# Patient Record
Sex: Male | Born: 1960
Health system: Southern US, Community
[De-identification: ages and names within clinical notes are randomized; demographics above are authoritative.]

## PROBLEM LIST (undated history)

## (undated) DIAGNOSIS — K222 Esophageal obstruction: Secondary | ICD-10-CM

## (undated) DIAGNOSIS — I4891 Unspecified atrial fibrillation: Secondary | ICD-10-CM

## (undated) DIAGNOSIS — M1990 Unspecified osteoarthritis, unspecified site: Secondary | ICD-10-CM

## (undated) DIAGNOSIS — I10 Essential (primary) hypertension: Secondary | ICD-10-CM

## (undated) DIAGNOSIS — G4730 Sleep apnea, unspecified: Secondary | ICD-10-CM

## (undated) DIAGNOSIS — E785 Hyperlipidemia, unspecified: Secondary | ICD-10-CM

## (undated) DIAGNOSIS — K219 Gastro-esophageal reflux disease without esophagitis: Secondary | ICD-10-CM

## (undated) DIAGNOSIS — I700 Atherosclerosis of aorta: Secondary | ICD-10-CM

## (undated) DIAGNOSIS — Z87442 Personal history of urinary calculi: Secondary | ICD-10-CM

## (undated) HISTORY — DX: Gastro-esophageal reflux disease without esophagitis: K21.9

## (undated) HISTORY — DX: Esophageal obstruction: K22.2

## (undated) HISTORY — DX: Essential (primary) hypertension: I10

## (undated) HISTORY — DX: Unspecified atrial fibrillation: I48.91

## (undated) HISTORY — DX: Atherosclerosis of aorta: I70.0

## (undated) HISTORY — DX: Hyperlipidemia, unspecified: E78.5

---

## 1999-10-12 ENCOUNTER — Encounter: Payer: Self-pay | Admitting: Cardiology

## 2001-04-10 ENCOUNTER — Encounter: Payer: Self-pay | Admitting: Cardiology

## 2002-04-23 ENCOUNTER — Encounter: Payer: Self-pay | Admitting: Cardiology

## 2003-04-27 ENCOUNTER — Emergency Department (HOSPITAL_COMMUNITY): Admission: EM | Admit: 2003-04-27 | Discharge: 2003-04-28 | Payer: Self-pay | Admitting: Internal Medicine

## 2003-08-22 ENCOUNTER — Encounter: Payer: Self-pay | Admitting: Cardiology

## 2005-03-25 ENCOUNTER — Observation Stay (HOSPITAL_COMMUNITY): Admission: AD | Admit: 2005-03-25 | Discharge: 2005-03-25 | Payer: Self-pay | Admitting: Internal Medicine

## 2005-03-25 ENCOUNTER — Ambulatory Visit: Payer: Self-pay | Admitting: Internal Medicine

## 2005-04-01 HISTORY — PX: OTHER SURGICAL HISTORY: SHX169

## 2005-04-03 ENCOUNTER — Ambulatory Visit (HOSPITAL_COMMUNITY): Admission: RE | Admit: 2005-04-03 | Discharge: 2005-04-03 | Payer: Self-pay | Admitting: Orthopaedic Surgery

## 2005-04-18 ENCOUNTER — Ambulatory Visit: Payer: Self-pay | Admitting: Internal Medicine

## 2005-05-10 ENCOUNTER — Ambulatory Visit (HOSPITAL_COMMUNITY): Admission: RE | Admit: 2005-05-10 | Discharge: 2005-05-10 | Payer: Self-pay | Admitting: Internal Medicine

## 2005-05-10 ENCOUNTER — Ambulatory Visit: Payer: Self-pay | Admitting: Internal Medicine

## 2005-05-10 ENCOUNTER — Encounter (INDEPENDENT_AMBULATORY_CARE_PROVIDER_SITE_OTHER): Payer: Self-pay | Admitting: Internal Medicine

## 2005-05-30 ENCOUNTER — Ambulatory Visit (HOSPITAL_COMMUNITY): Admission: RE | Admit: 2005-05-30 | Discharge: 2005-05-31 | Payer: Self-pay | Admitting: Neurosurgery

## 2006-05-01 ENCOUNTER — Ambulatory Visit: Payer: Self-pay | Admitting: Internal Medicine

## 2006-05-06 ENCOUNTER — Encounter (HOSPITAL_COMMUNITY): Admission: RE | Admit: 2006-05-06 | Discharge: 2006-06-05 | Payer: Self-pay | Admitting: Internal Medicine

## 2006-05-22 ENCOUNTER — Ambulatory Visit (HOSPITAL_COMMUNITY): Admission: RE | Admit: 2006-05-22 | Discharge: 2006-05-22 | Payer: Self-pay | Admitting: Internal Medicine

## 2006-06-09 ENCOUNTER — Emergency Department (HOSPITAL_COMMUNITY): Admission: EM | Admit: 2006-06-09 | Discharge: 2006-06-09 | Payer: Self-pay | Admitting: Emergency Medicine

## 2006-06-16 ENCOUNTER — Encounter: Payer: Self-pay | Admitting: Cardiology

## 2006-07-09 ENCOUNTER — Ambulatory Visit (HOSPITAL_COMMUNITY): Admission: RE | Admit: 2006-07-09 | Discharge: 2006-07-09 | Payer: Self-pay | Admitting: Neurosurgery

## 2006-07-10 ENCOUNTER — Ambulatory Visit: Payer: Self-pay | Admitting: Cardiology

## 2006-07-16 ENCOUNTER — Inpatient Hospital Stay (HOSPITAL_BASED_OUTPATIENT_CLINIC_OR_DEPARTMENT_OTHER): Admission: RE | Admit: 2006-07-16 | Discharge: 2006-07-16 | Payer: Self-pay | Admitting: Cardiology

## 2006-07-16 ENCOUNTER — Ambulatory Visit: Payer: Self-pay | Admitting: Cardiology

## 2006-07-29 ENCOUNTER — Encounter: Payer: Self-pay | Admitting: Cardiology

## 2006-08-01 ENCOUNTER — Ambulatory Visit (HOSPITAL_COMMUNITY): Admission: RE | Admit: 2006-08-01 | Discharge: 2006-08-01 | Payer: Self-pay | Admitting: Neurosurgery

## 2006-10-30 ENCOUNTER — Emergency Department (HOSPITAL_COMMUNITY): Admission: EM | Admit: 2006-10-30 | Discharge: 2006-10-30 | Payer: Self-pay | Admitting: Emergency Medicine

## 2007-04-08 ENCOUNTER — Emergency Department (HOSPITAL_COMMUNITY): Admission: EM | Admit: 2007-04-08 | Discharge: 2007-04-08 | Payer: Self-pay | Admitting: Emergency Medicine

## 2007-08-08 ENCOUNTER — Emergency Department (HOSPITAL_COMMUNITY): Admission: EM | Admit: 2007-08-08 | Discharge: 2007-08-08 | Payer: Self-pay | Admitting: Emergency Medicine

## 2007-11-03 IMAGING — CT CT L SPINE W/ CM
3 of 7 series · 15 of 33 positions shown, 18 images · non-contrast
Comparison: none

CLINICAL DATA: Left leg pain and numbness.  
 LUMBAR MYELOGRAM:
TECHNIQUE: Lumbar puncture was performed by Dr. Dimetre on the left at L4-5.
TECHNIQUE: Multidetector CT imaging of the lumbar spine was performed after intrathecal injection of contrast.  Multiplanar CT image reconstructions were also generated.

[Series 4: 2mm axial soft tissue · axial · 0.28mm/px · z∈[-263,-95]mm · 7 of 109 slices shown, 9 images]
[im 13/109  soft-tissue]
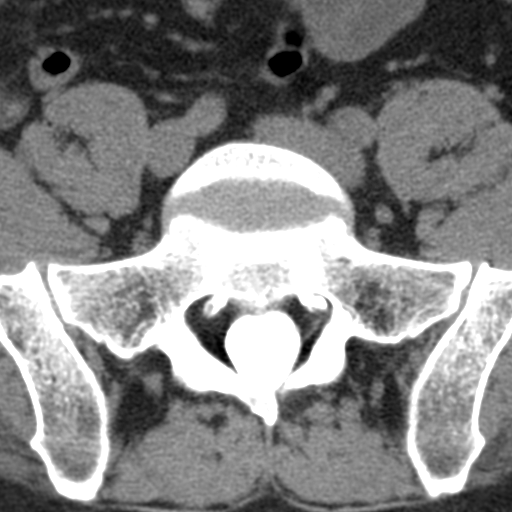
[im 13/109  bone]
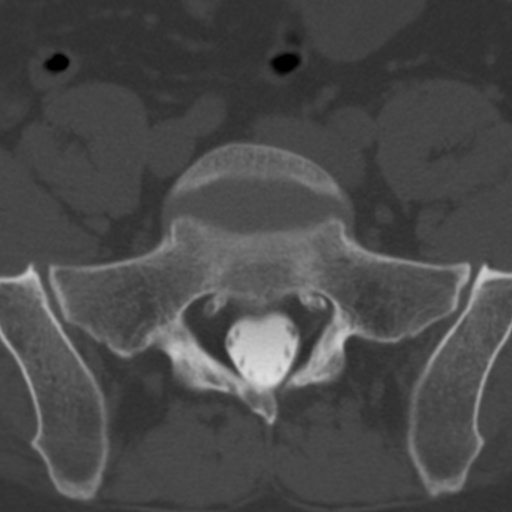
[im 25/109  bone]
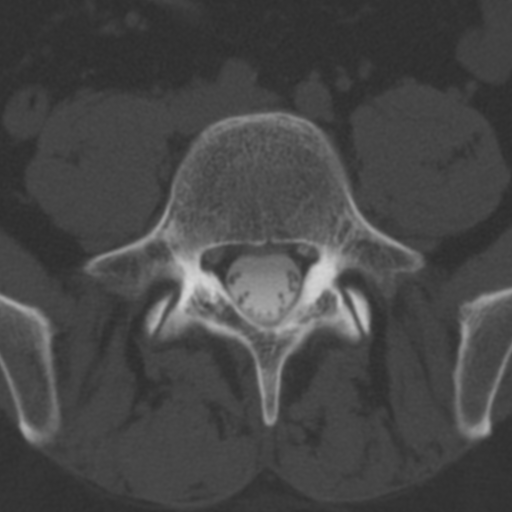
[im 37/109  bone]
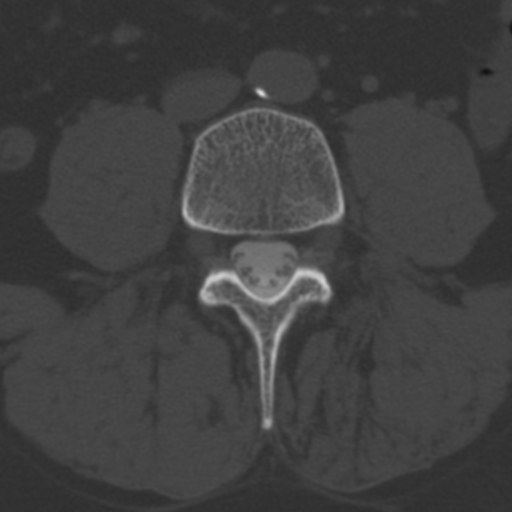
[im 61/109  bone]
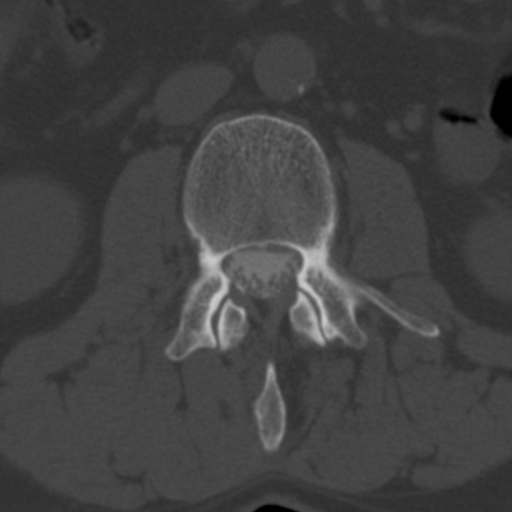
[im 73/109  soft-tissue]
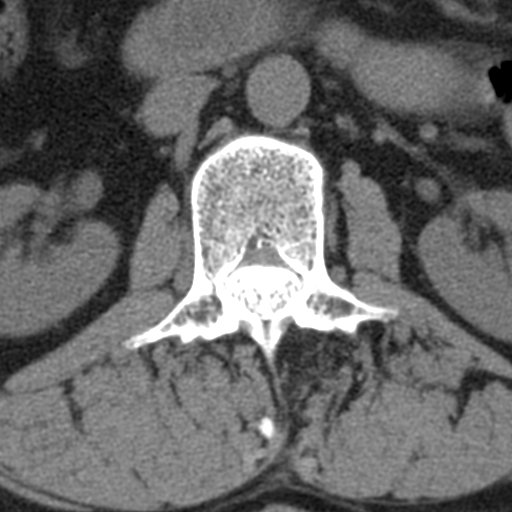
[im 73/109  bone]
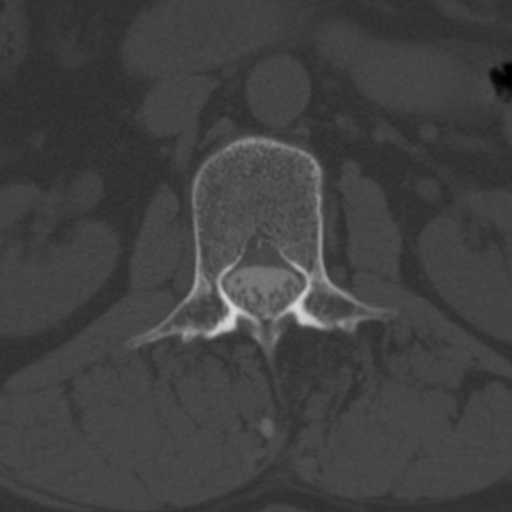
[im 85/109  bone]
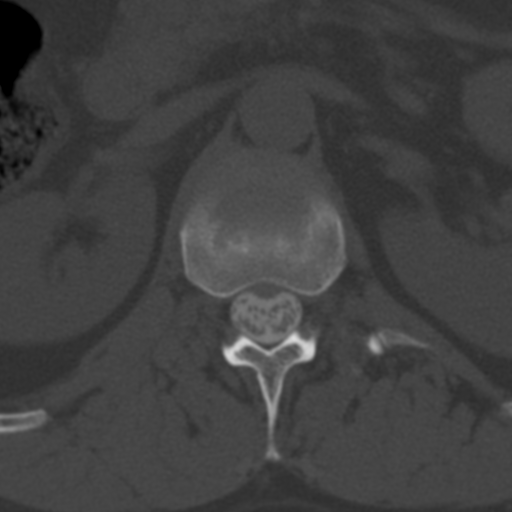
[im 97/109  bone]
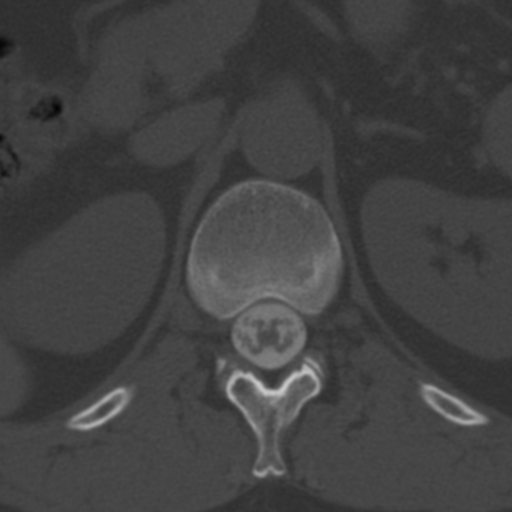

[Series 602: spine cor · coronal · 0.43mm/px · 3 of 36 slices shown]
[im 8/36  bone]
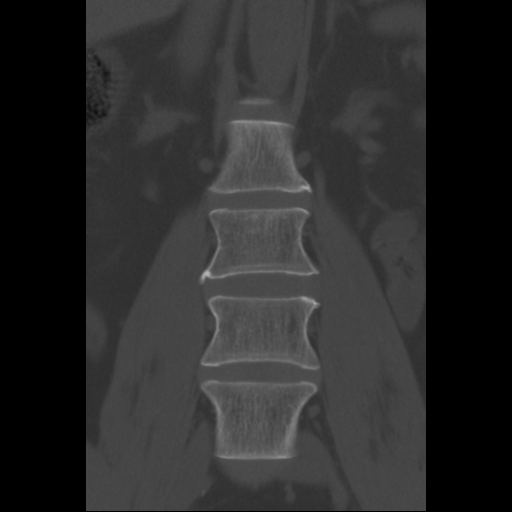
[im 15/36  bone]
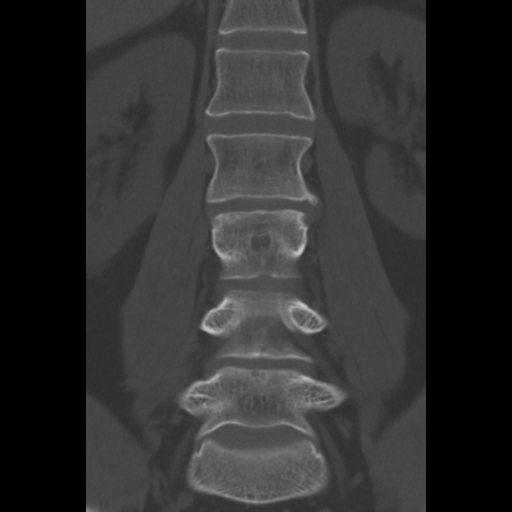
[im 22/36  bone]
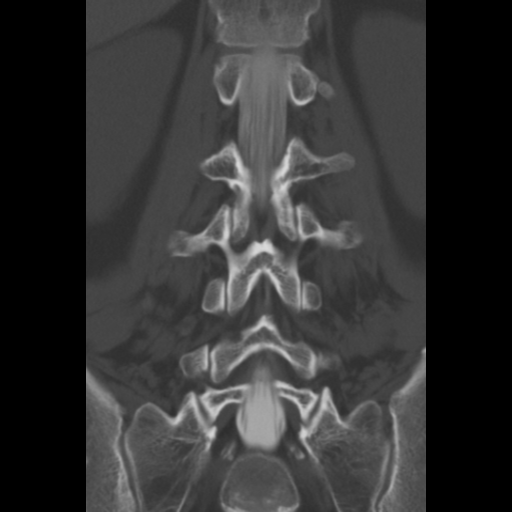

[Series 608: spine sag · sagittal · 0.43mm/px · 5 of 31 slices shown, 6 images]
[im 11/31  bone]
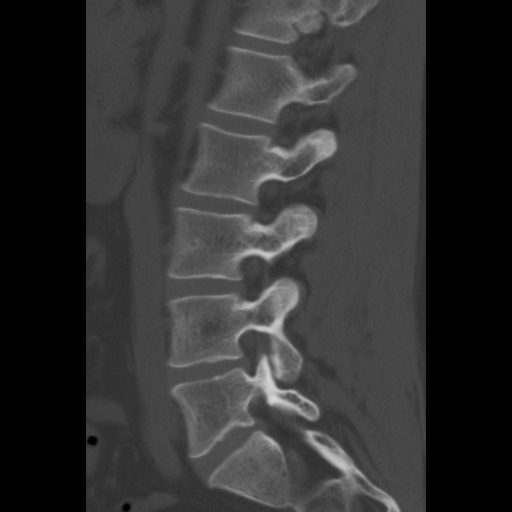
[im 13/31  bone]
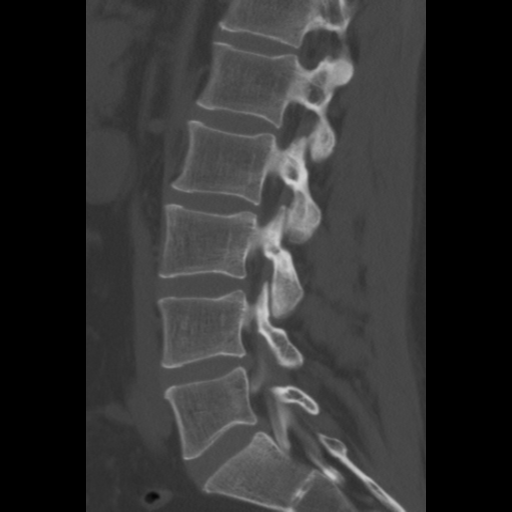
[im 16/31  soft-tissue]
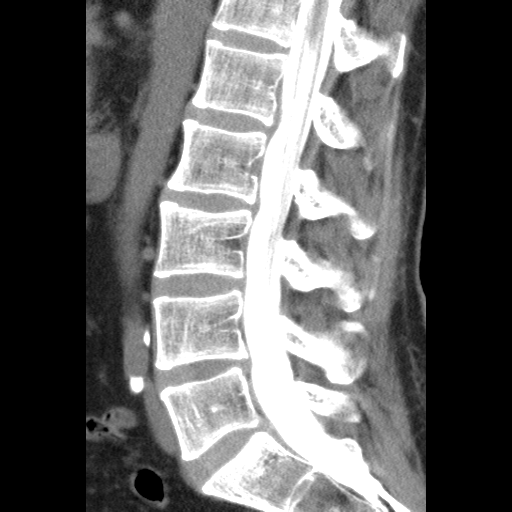
[im 16/31  bone]
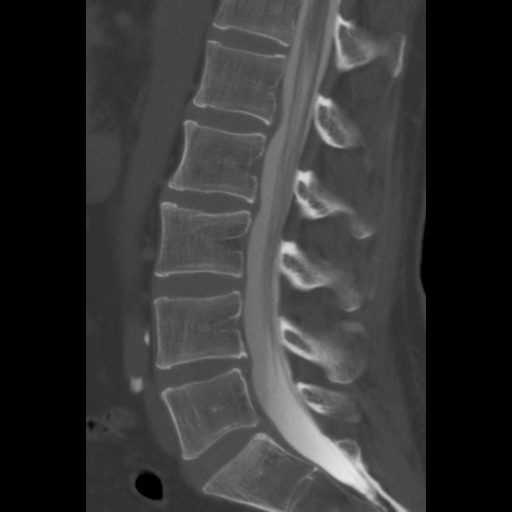
[im 18/31  bone]
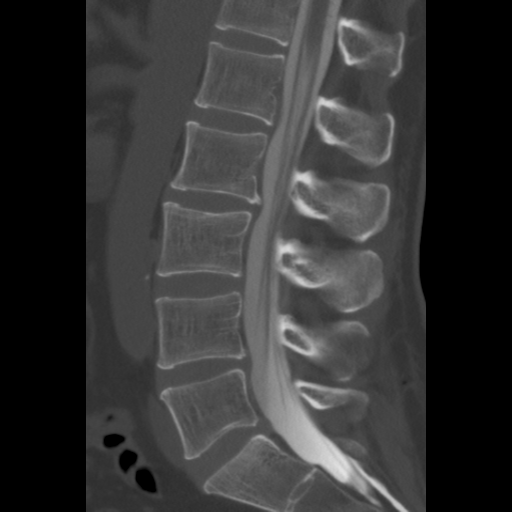
[im 21/31  bone]
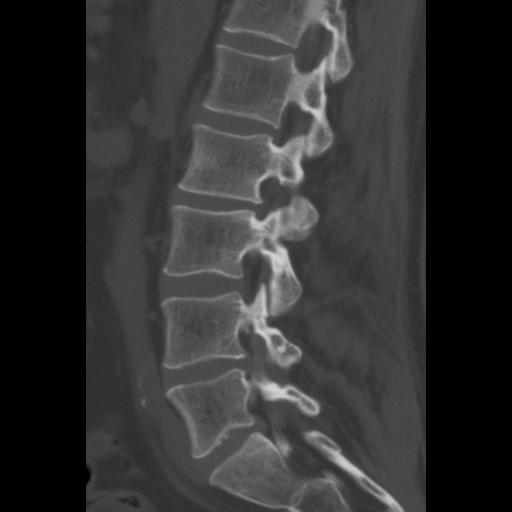

[15 of 33 positions shown; findings below may reference images not displayed]

FINDINGS: Spinal canal appears widely patent.  Root sleeves fill normally.  No compressive pathology is seen.  The patient does appear to have some facet degeneration at L4-5 and L5-S1.
IMPRESSION: No sign of compressive pathology.  Mild lower lumbar facet degenerative disease. 
 CT LUMBAR SPINE WITH CONTRAST (POST-MYELOGRAM):
FINDINGS: T12-L1:  Normal interspace.
 L1-2:  Central disc herniation with upward migration of disc material in the midline behind L-1.  This indents the thecal sac but does not appear to result in neural compression.
 L2-3:  There is disc degeneration with chronic left posterolateral protrusion with osteophytes.  There is mild narrowing of the left lateral recess and encroachment upon the intervertebral foramen on the left.  It is possible the left L-2 nerve root could be irritated. 
 L3-4:  Normal interspace.
 L4-5:  Mild disc bulge.  Mild facet hypertrophy.  No apparent neural compression. 
 L5-S1:  Shallow disc protrusion that contacts the thecal sac and S-1 nerve roots but does not compress or displace them.  Mild facet degeneration bilaterally.
IMPRESSION: 1.  L1-2:   Central disc herniation with upward migration of disc material in the midline behind L-1.  This indents the thecal sac but does not appear to result in neural compression.
 2.  L2-3:  Left posterolateral osteophytes and chronic disc protrusion that narrows the left lateral recess and encroaches upon the intervertebral foramen on the left.  The left L-2 nerve root could be irritated.  
 3.  No significant finding from L3-4 through L5-S1.  Mild facet degeneration at L4-5 and L5-S1 and small disc protrusion at L5-S1 which contacts the thecal sac and S-1 nerve roots but does not appear to compress or displace them.

## 2007-11-26 IMAGING — CR DG LUMBAR SPINE 2-3V
1 series · 1 of 1 positions shown · non-contrast
Comparison: none

CLINICAL DATA: Ruptured disc, radiculopathy L2-3 far lateral discectomy.
 PORTABLE LUMBAR SPINE IN OPERATING ROOM - 2 VIEW:

[view not recorded]
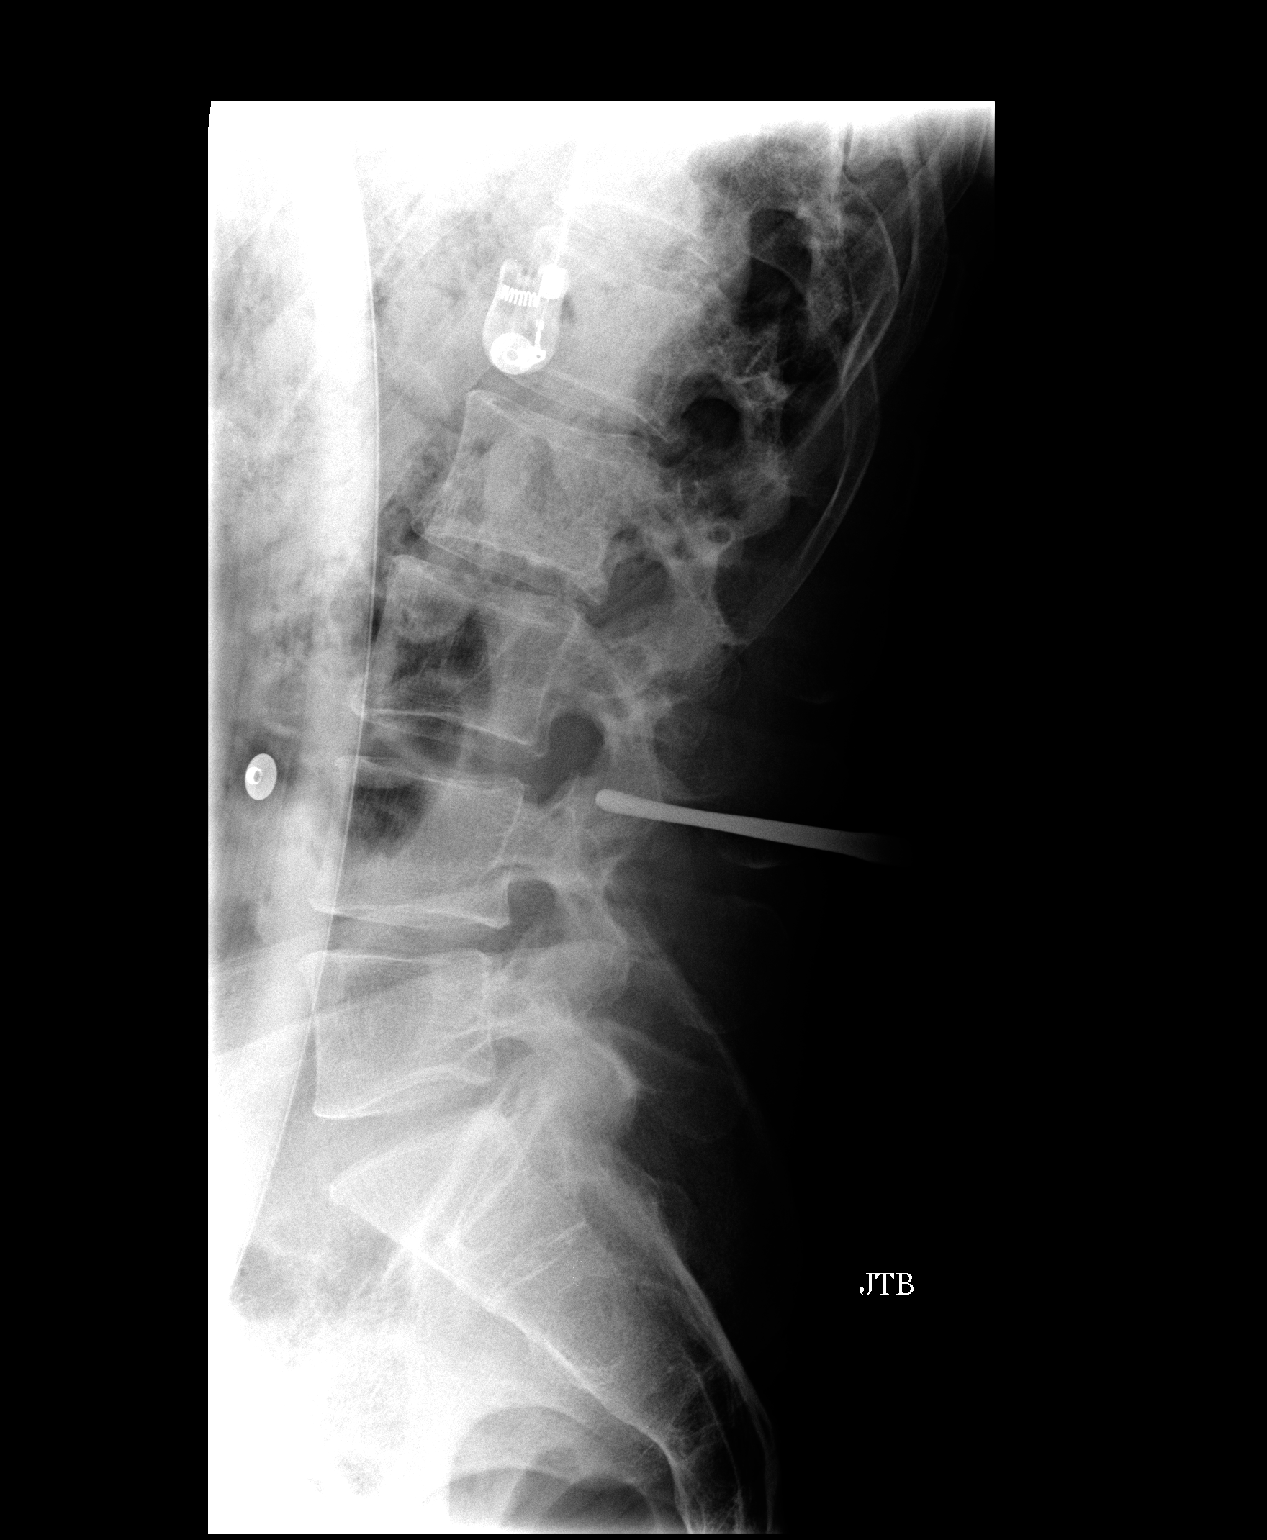

[1 of 1 positions shown; findings below may reference images not displayed]

FINDINGS: Film #1 was taken at 7837 hours revealing surgical instrument pointer posteriorly aimed at L3-4 interspace. The second view taken at 9919 hours reveals retractors in place and surgical instrument posteriorly at L2-3 level.
IMPRESSION: Intraoperative localization at L2-3.

## 2008-01-26 ENCOUNTER — Emergency Department (HOSPITAL_COMMUNITY): Admission: EM | Admit: 2008-01-26 | Discharge: 2008-01-26 | Payer: Self-pay | Admitting: Emergency Medicine

## 2008-05-20 ENCOUNTER — Emergency Department (HOSPITAL_COMMUNITY): Admission: EM | Admit: 2008-05-20 | Discharge: 2008-05-20 | Payer: Self-pay | Admitting: Emergency Medicine

## 2008-12-11 ENCOUNTER — Emergency Department (HOSPITAL_COMMUNITY): Admission: EM | Admit: 2008-12-11 | Discharge: 2008-12-11 | Payer: Self-pay | Admitting: Emergency Medicine

## 2010-05-07 DIAGNOSIS — I10 Essential (primary) hypertension: Secondary | ICD-10-CM | POA: Insufficient documentation

## 2010-05-07 DIAGNOSIS — E785 Hyperlipidemia, unspecified: Secondary | ICD-10-CM | POA: Insufficient documentation

## 2010-05-07 DIAGNOSIS — R002 Palpitations: Secondary | ICD-10-CM | POA: Insufficient documentation

## 2010-05-08 ENCOUNTER — Ambulatory Visit (INDEPENDENT_AMBULATORY_CARE_PROVIDER_SITE_OTHER): Payer: PRIVATE HEALTH INSURANCE | Admitting: Cardiology

## 2010-05-08 ENCOUNTER — Encounter: Payer: Self-pay | Admitting: Cardiology

## 2010-05-08 ENCOUNTER — Encounter (INDEPENDENT_AMBULATORY_CARE_PROVIDER_SITE_OTHER): Payer: Self-pay | Admitting: *Deleted

## 2010-05-08 DIAGNOSIS — F17200 Nicotine dependence, unspecified, uncomplicated: Secondary | ICD-10-CM

## 2010-05-08 DIAGNOSIS — I359 Nonrheumatic aortic valve disorder, unspecified: Secondary | ICD-10-CM

## 2010-05-08 DIAGNOSIS — I480 Paroxysmal atrial fibrillation: Secondary | ICD-10-CM | POA: Insufficient documentation

## 2010-05-08 DIAGNOSIS — I4891 Unspecified atrial fibrillation: Secondary | ICD-10-CM

## 2010-05-08 DIAGNOSIS — I10 Essential (primary) hypertension: Secondary | ICD-10-CM

## 2010-05-10 ENCOUNTER — Encounter: Payer: Self-pay | Admitting: Cardiology

## 2010-05-10 DIAGNOSIS — I359 Nonrheumatic aortic valve disorder, unspecified: Secondary | ICD-10-CM

## 2010-05-10 DIAGNOSIS — I4891 Unspecified atrial fibrillation: Secondary | ICD-10-CM

## 2010-05-17 NOTE — Letter (Signed)
Summary: Pharmacist, community at [facility]. [address]   [place], [place] [address]   Phone: [phone]  Fax: [phone]      [facility] Cardiovascular Services  Cardiolite Stress Test     A[facility] [place]son  Your doctor has ordered a Cardiolite Stress Test to help determine the condition of your heart during stress. If you take blood pressure medicine, ask your doctor if you should take it the day of your test. You should not have anything to eat or drink at least 4 hours before your test is scheduled, and no caffeine (coffee, tea, decaf. or chocolate) for 24 hours before your test.  You will need to register at the Outpatient/Main Entrance at the hospital 30 minutes before your appointment time. It is a good idea to bring a copy of your order with you. They will direct you to the Diagnostic Imaging (Radiology) Department.  You will be asked to undress from the waist up and be given a hospital gown to wear, so dress comfortably from the waist down, for example:    Sweat pants, shorts or skirt   Rubber-soled lace up shoes (i.e. tennis shoes)  Plan on about three hours from registration to release from the hospital.    Date of Test:              Time of Test              Hold Diltiazem the morning of your test. You may take your remaining medications the morning of your test with water.

## 2010-05-17 NOTE — Assessment & Plan Note (Signed)
Summary: NP-LAST SEEN 2008 PER [facility] PER [facility]ANYA  NEW ON SET AFIB ASA...   Visit Type:  New patient visit Primary Provider:  Dr. Dwana Melena   History of Present Illness: 50 year old male, last seen in the office back in 2008. He is here with his wife.  Cardiac history is outlined below. He is referred back to our practice with a recent history of palpitations. He states that past Thursday while he was at work he began to experience "jumps" and also "flips" that lasted for at least 6 hours with sense of rapid heart rate. He states that this had happened in the past but only very briefly. He saw Dr. Margo Aye in the office and had an ECG performed that demonstrated rapid atrial fibrillation at 137 beats per minute, nonspecific ST changes.   He was initiated on Coumadin and Cardizem CD, outlined below. He reports improvement in symptoms, and is noted to be in sinus rhythm today. Other than this he has experienced some "slight" chest pains, not clearly exertional, occasional shortness of breath.  No followup ischemic testing since 2008.  Preventive Screening-Counseling & Management  Alcohol-Tobacco     Cans of tobacco/week: dip 5 cans snuff/week     Tobacco Counseling: to quit use of tobacco products  Current Medications (verified): 1)  Bystolic 10 Mg Tabs (Nebivolol Hcl) .... Take 1 Tablet By Mouth Once A Day 2)  Lansoprazole 15 Mg Tbdp (Lansoprazole) .... Take 1 Tablet By Mouth Once A Day 3)  Warfarin Sodium 5 Mg Tabs (Warfarin Sodium) .... Use As Directed 4)  Cardizem Cd 180 Mg Xr24h-Cap (Diltiazem Hcl Coated Beads) .... Take 1 Tablet By Mouth Once A Day  Allergies (verified): No Known Drug Allergies  Comments:  Nurse/Medical Assistant: The patient's medication bottles and allergies were reviewed with the patient and were updated in the Medication and Allergy Lists.  Past History:  Family History: Last updated: 05/08/2010 Brother: status post MI at age 18 Brother: status post  aortic valve replacement age 30 secondary to bicuspid valve Father: deceased, age 73, history of congestive heart failure  Social History: Last updated: 05/08/2010 Tobacco Use - Yes.  Alcohol Use - no Drug Use - no Married   Past Medical History: Hypertension Hyperlipidemia G E R D Esophageal stricture status post multiple dilatations Palpitations No significant CAD noted by catheterization in 2008  Past Surgical History: Lumbar laminectomy March 2007, by Dr. Coletta Memos Partial amputation of the right third digit  Family History: Brother: status post MI at age 38 Brother: status post aortic valve replacement age 13 secondary to bicuspid valve Father: deceased, age 77, history of congestive heart failure  Social History: Tobacco Use - Yes.  Alcohol Use - no Drug Use - no Married  Cans of tobacco/week:  dip 5 cans snuff/week  Review of Systems  The patient denies anorexia, fever, weight loss, syncope, dyspnea on exertion, peripheral edema, prolonged cough, hemoptysis, melena, and hematochezia.         Otherwise reviewed and negative except as outlined.  Vital Signs:  Patient profile:   50 year old male Height:      71 inches Weight:      192 pounds BMI:     26.88 Pulse rate:   73 / minute BP sitting:   120 / 82  (left arm) Cuff size:   regular  Vitals Entered By: Carlye Grippe (May 08, 2010 9:48 AM)  Nutrition Counseling: Patient's BMI is greater than 25 and therefore counseled on  weight management options.  Physical Exam  Additional Exam:  Overweight male in no acute distress. HEENT: Conjunctivae and lids normal, oropharynx with moist mucosa. Neck: Supple, no elevated JVP or bruits, no thyromegaly. Lungs: Clear to auscultation, nonlabored. Cardiac: Regular rate and rhythm, no rub or S3 gallop. Soft systolic murmur at the base, no loud diastolic murmur. Abdomen: Soft, nontender, bowel sounds present. Skin: Warm and dry. Musculoskeletal: No  kyphosis. Extremities: No pitting edema, distal pulses full. Neuropsychiatric: Alert and oriented x3, affect appropriate   Cardiac Cath  Procedure date:  07/16/2006  Findings:      ANGIOGRAPHIC FINDINGS:  1. The left main coronary artery is relatively short and gives rise to      the left anterior descending and circumflex vessels.  No      significant flow-limiting coronary atherosclerosis is noted.  2. The left anterior descending is a small caliber vessel relative to      catheter size.  There are three diagonal branches.  The more      proximal branches being the largest.  No significant flow-limiting      coronary atherosclerosis is noted.  3. The circumflex vessel is a medium caliber dominant vessel,      providing the posterior descending branch which is relatively      small.  Otherwise, there are four obtuse marginal branches.  The      most proximal of which is nearly a ramus intermedius branch; and is      quite large and bifurcating.  No significant flow-limiting coronary      atherosclerosis is noted.  4. The right coronary artery is a small nondominant vessel with three      branches; two of which are small right ventricular marginal      branches.  No obvious flow-limiting coronary atherosclerosis is      noted.   Left ventriculography was performed in the RAO projection and reveals an  ejection fraction of approximately 55% with no significant mitral  regurgitation.   Aortic root injection was obtained in the LAO projection; and revealed  trivial aortic regurgitation.  No obvious aortic dissection is noted.  The aortic valve, proper, is somewhat hard to visualize but appears to  be trileaflet.  EKG  Procedure date:  05/08/2010  Findings:      Normal sinus rhythm at 69 beats per minute.  Impression & Recommendations:  Problem # 1:  ATRIAL FIBRILLATION (ICD-427.31)  Recently diagnosed, spontaneously converted to sinus rhythm with rate control. ECG is  normal today. Could be that prior history of brief palpitations are also related. Patient's CHADS2 score is one based on hypertension. We discussed this today, and for the time being we will discontinue Coumadin and initiate full dose aspirin daily. Certainly if he manifests recurrent episodes of atrial fibrillation, or persists, we may need to reinstitute Coumadin if a cardioversion is to be considered. Otherwise we will proceed with a followup ischemic workup as antiarrhythmic therapy may also need to be considered. Echocardiogram will be arranged to reassess cardiac structure and function. I will see him back over the next month.  The following medications were removed from the medication list:    Toprol Xl 25 Mg Xr24h-tab (Metoprolol succinate) .Marland Kitchen... Take 1 tablet by mouth twie a day    Warfarin Sodium 5 Mg Tabs (Warfarin sodium) ..... Use as directed His updated medication list for this problem includes:    Bystolic 10 Mg Tabs (Nebivolol hcl) .Marland Kitchen... Take 1 tablet by  mouth once a day    Aspirin Ec 325 Mg Tbec (Aspirin) .Marland Kitchen... Take one tablet by mouth daily  Orders: T-Basic Metabolic Panel [phone]) T-TSH [phone]) Nuclear Med (Nuc Med) 2-D Echocardiogram (2D Echo)  Problem # 2:  AORTIC VALVE DISORDERS (ICD-424.1)  History of aortic regurgitation, followup echocardiogram.  The following medications were removed from the medication list:    Toprol Xl 25 Mg Xr24h-tab (Metoprolol succinate) .Marland Kitchen... Take 1 tablet by mouth twie a day His updated medication list for this problem includes:    Bystolic 10 Mg Tabs (Nebivolol hcl) .Marland Kitchen... Take 1 tablet by mouth once a day  Orders: 2-D Echocardiogram (2D Echo)  Problem # 3:  HYPERLIPIDEMIA-MIXED (ICD-272.4)  Followed by Dr. Margo Aye.  The following medications were removed from the medication list:    Lovastatin 20 Mg Tabs (Lovastatin) .Marland Kitchen... Take 1 tablet by mouth once a day  Problem # 4:  HYPERTENSION, BENIGN (ICD-401.1)  Blood pressure is  normal today.  The following medications were removed from the medication list:    Toprol Xl 25 Mg Xr24h-tab (Metoprolol succinate) .Marland Kitchen... Take 1 tablet by mouth twie a day His updated medication list for this problem includes:    Bystolic 10 Mg Tabs (Nebivolol hcl) .Marland Kitchen... Take 1 tablet by mouth once a day    Cardizem Cd 180 Mg Xr24h-cap (Diltiazem hcl coated beads) .Marland Kitchen... Take 1 tablet by mouth once a day    Aspirin Ec 325 Mg Tbec (Aspirin) .Marland Kitchen... Take one tablet by mouth daily  Orders: Nuclear Med (Nuc Med)  Other Orders: EKG w/ Interpretation ([id number])  Patient Instructions: 1)  Follow up with Dr. Diona Browner on Friday, June 08, 2010 at 9:40pm. 2)  Your physician recommends that you go to the [facility] for lab work: (TSH/BMET) 3)  Your physician has requested that you have an echocardiogram.  Echocardiography is a painless test that uses sound waves to create images of your heart. It provides your doctor with information about the size and shape of your heart and how well your heart's chambers and valves are working.  This procedure takes approximately one hour. There are no restrictions for this procedure. 4)  Your physician has requested that you have an exercise stress cardiolite.  For further information please visit [email or web address]  Please follow instruction sheet, as given. 5)  Stop Coumadin (warfarin) 6)  Your physician discussed the risks, benefits and indications for preventive aspirin therapy. It is recommended that you start (or continue) taking 325 mg of aspirin a day. (Start Thursday, 2/9)

## 2010-05-17 NOTE — Cardiovascular Report (Signed)
Summary: Cardiac Catheterization  Cardiac Catheterization   Imported By: Dorise Hiss 05/07/2010 16:51:37  _____________________________________________________________________  External Attachment:    Type:   Image     Comment:   External Document

## 2010-05-18 ENCOUNTER — Encounter (INDEPENDENT_AMBULATORY_CARE_PROVIDER_SITE_OTHER): Payer: Self-pay | Admitting: *Deleted

## 2010-05-23 NOTE — Letter (Signed)
Summary: Engineer, materials at [facility]  [address]   [place], [place] [address]   Phone: [phone]  Fax: [phone]        May 18, 2010 MRN: [id number]    ABDOULIE TIERCE [address] [place], [place]  [address]    Dear Mr. SHERK,  Your test ordered by Selena Batten has been reviewed by your physician (or physician assistant) and was found to be normal or stable. Your physician (or physician assistant) felt no changes were needed at this time.  __X__ Echocardiogram            Keep March 9th office visit for further review.  __X__ Cardiac Stress Test  __X__ Lab Work  ____ Peripheral vascular study of arms, legs or neck  ____ CT scan or X-ray  ____ Lung or Breathing test  ____ Other:   Thank you.   Cyril Loosen, RN, BSN    Duane Boston, M.D., F.A.C.C. Thressa Sheller, M.D., F.A.C.C. Oneal Grout, M.D., F.A.C.C. Cheree Ditto, M.D., F.A.C.C. Daiva Nakayama, M.D., F.A.C.C. Kenney Houseman, M.D., F.A.C.C. Jeanne Ivan, PA-C

## 2010-06-08 ENCOUNTER — Ambulatory Visit: Payer: PRIVATE HEALTH INSURANCE | Admitting: Cardiology

## 2010-07-16 ENCOUNTER — Ambulatory Visit (INDEPENDENT_AMBULATORY_CARE_PROVIDER_SITE_OTHER): Payer: PRIVATE HEALTH INSURANCE | Admitting: Cardiology

## 2010-07-16 ENCOUNTER — Encounter: Payer: Self-pay | Admitting: Cardiology

## 2010-07-16 VITALS — BP 133/80 | HR 75 | Ht 71.0 in | Wt 193.0 lb

## 2010-07-16 DIAGNOSIS — I10 Essential (primary) hypertension: Secondary | ICD-10-CM

## 2010-07-16 DIAGNOSIS — E782 Mixed hyperlipidemia: Secondary | ICD-10-CM

## 2010-07-16 DIAGNOSIS — I4891 Unspecified atrial fibrillation: Secondary | ICD-10-CM

## 2010-07-16 DIAGNOSIS — I359 Nonrheumatic aortic valve disorder, unspecified: Secondary | ICD-10-CM

## 2010-07-16 NOTE — Progress Notes (Signed)
Clinical Summary Wesley [name] is a 50 y.o.male presenting for followup. He was seen in February of this year with newly diagnosed atrial fibrillation. Subsequent cardiac testing was arranged, outlined below, and reassuring overall. We reviewed the results today.  He reports only occasional palpitations, usually very brief, no prolonged episodes like he had experienced prior to our first visit. We discussed the general pathophysiology of atrial fibrillation, potential use of antiarrhythmic therapy, and for now he prefers observation on the present regimen.  Lab work from February showed BUN 16, creatinine 1.1, sodium 140, potassium 3.8, TSH 1.3. He has had more recent labs done with Dr. Margo Aye, LDL was noted to be in the 140s. Crestor was recommended, although he was hesitant to start it. We discussed this today.   No Known Allergies  Current outpatient prescriptions:aspirin 325 MG tablet, Take 325 mg by mouth daily.  , Disp: , Rfl: ;  Cholecalciferol (VITAMIN D3) 1000 UNITS CAPS, Take 1,000 capsules by mouth daily.  , Disp: , Rfl: ;  nebivolol (BYSTOLIC) 10 MG tablet, Take 10 mg by mouth daily.  , Disp: , Rfl: ;  omeprazole (PRILOSEC) 20 MG capsule, Take 20 mg by mouth daily.  , Disp: , Rfl:  DISCONTD: diltiazem (CARDIZEM CD) 180 MG 24 hr capsule, Take 180 mg by mouth daily.  , Disp: , Rfl: ;  DISCONTD: lansoprazole (PREVACID) 15 MG capsule, Take 15 mg by mouth daily.  , Disp: , Rfl:   Past Medical History  Diagnosis Date  . Hypertension   . Hyperlipidemia   . GERD (gastroesophageal reflux disease)   . Esophageal stricture     Multiple dilations  . Atrial fibrillation     CHADS2 score 1    Social History Wesley [name] reports that he quit smoking about 18 months ago. His smoking use included Cigarettes. He has a 7.5 pack-year smoking history. His smokeless tobacco use includes Snuff. Wesley [name] reports that he does not drink alcohol.  Review of Systems As outlined above, otherwise reviewed and  negative.  Physical Examination Filed Vitals:   07/16/10 1540  BP: 133/80  Pulse: 75   Overweight male in no acute distress. HEENT: Conjunctivae and lids normal, oropharynx with moist mucosa. Neck: Supple, no elevated JVP or bruits, no thyromegaly. Lungs: Clear to auscultation, nonlabored. Cardiac: Regular rate and rhythm, no rub or S3 gallop. Soft systolic murmur at the base, no loud diastolic murmur. Abdomen: Soft, nontender, bowel sounds present. Skin: Warm and dry. Musculoskeletal: No kyphosis. Extremities: No pitting edema, distal pulses full. Neuropsychiatric: Alert and oriented x3, affect appropriate.  ECG Normal sinus rhythm at 74 beats per minute.  Studies Exercise Cardiolite 05/10/2010: No diagnostic ST segment changes, LVEF 59%, no ischemic defects.  Echocardiogram 05/10/2010: Moderate basal septal thickening with LVEF 55-60%, mild aortic regurgitation, no dilatation of the ascending aorta.  Problem List and Plan

## 2010-07-16 NOTE — Assessment & Plan Note (Signed)
LDL recently noted to be in the 140s. Dr. Margo Aye recommended Crestor. Patient had some concerns about cost. It might be worth considering placing him on atorvastatin 10 mg daily (generic) and then rechecking his lipids and liver function tests in the next 12 weeks. He plans to discuss this further with Dr. Margo Aye.

## 2010-07-16 NOTE — Assessment & Plan Note (Signed)
Continue regular followup with Dr. Hall. 

## 2010-07-16 NOTE — Patient Instructions (Signed)
Continue all current medications. Your physician wants you to follow up in: 6 months.  You will receive a reminder letter in the mail one-two months in advance.  If you don't receive a letter, please call our office to schedule the follow up appointment   

## 2010-07-16 NOTE — Assessment & Plan Note (Signed)
Paroxysmal, CHADS2 score 1. Continue aspirin, beta blocker, observation for now. We did discuss the possibility of antiarrhythmic therapy. Flecainide would be a consideration given reassuring ischemic workup.

## 2010-07-16 NOTE — Assessment & Plan Note (Signed)
Mild aortic regurgitation, normal root size.

## 2010-08-17 NOTE — Op Note (Signed)
NAMERAEFORD, BRANDENBURG NO.:  [id number]   MEDICAL RECORD NO.:  [id number]          PATIENT TYPE:  OIB   LOCATION:  3039                         FACILITY:  [facility]   PHYSICIAN:  Coletta Memos, M.D.     DATE OF BIRTH:  06/20/1960   DATE OF PROCEDURE:  05/30/2005  DATE OF DISCHARGE:  03/25/2005                                 OPERATIVE REPORT   PREOPERATIVE DIAGNOSES:  1.  Far lateral displaced disk L2-3.  2.  Left L2 radiculopathy.   POSTOPERATIVE DIAGNOSES:  1.  Far lateral displaced disk L2-3.  2.  Left L2 radiculopathy.   PROCEDURE:  Left L2-3 far lateral diskectomy with microdissection.   SURGEON:  Coletta Memos, M.D.   ASSISTANT:  Stefani Dama, M.D.   COMPLICATIONS:  None.   INDICATIONS:  Wesley [name] is a 50 year old gentleman who has had severe pain  in his left lower extremity.  It comes around the hip into the groin and  anterior thigh.  It does not go below the knee. MRI showed a far lateral  disk at L2-3. I recommended, he agreed, to undergo operative decompression.   OPERATIVE NOTE:  Mr. Rudin was brought to the operating room intubated, and  placed under general anesthetic without difficulty. His back was prepped and  he was draped in sterile fashion. I infiltrated 20 cc of 1/2% lidocaine  1:200,000 epinephrine. I opened the skin with a #10 blade and took this down  to the thoracolumbar fascia. I then exposed the lamina of L2, L1, and L3. I  placed a double-ended ganglion knife inferior to the middle lamina, exposing  L1 and L2. I then proceeded with the far lateral diskectomy at L2-3. I used  a high-speed drill to take off just the outer edge of the pars  interarticularis. I removed soft tissue; and then was able to identify the  left L2 nerve root and the neural foramina. The pedicle was also easily  appreciated and seen.   I brought the microscope into the operative field and with microdissectors  then, worked around to feel if there was any  compression. There was a  definite bony ridge along the outer surface of the annulus, at that level.  With Dr. Verlee Rossetti assistance we then opened the annulus and then using  drills, curettes and other bony bone-biting instruments removed that bony  ridge. We then palpated distally along the path of the nerve root; and did  not feel any  compression of the nerve. It felt quite free. I then irrigated the wound. I  placed Depo-Medrol over the left L2 nerve root in the resection site, then  closed the wound in a layered fashion using Vicryl sutures. Dermabond was  used for a sterile dressing. The patient tolerated the procedure well.           ______________________________  Coletta Memos, M.D.     KC/MEDQ  D:  05/30/2005  T:  05/30/2005  Job:  [id number]

## 2010-08-17 NOTE — Cardiovascular Report (Signed)
Wesley [name], Wesley [name]                  ACCOUNT NO.:  [id number]   MEDICAL RECORD NO.:  [id number]           PATIENT TYPE:   LOCATION:                                 FACILITY:   PHYSICIAN:  Jonelle Sidle, MD      DATE OF BIRTH:   DATE OF PROCEDURE:  07/16/2006  DATE OF DISCHARGE:                            CARDIAC CATHETERIZATION   PRIMARY CARE PHYSICIAN:  Dr. Doreen Beam.   CARDIOLOGIST:  Jonelle Sidle, MD   INDICATIONS:  Wesley [name] is a 50 year old male with a family history of  cardiovascular disease as well as personal history of hypertension,  hyperlipidemia, tobacco use, and previous esophageal stricture requiring  dilatation.  He has been experiencing exertional chest pain concerning  for possible angina; and is as referred for a diagnostic cardiac  catheterization to clearly define the coronary anatomy.  The potential  risks and benefits were explained in advance; and informed consent was  obtained.   PROCEDURES PERFORMED:  1. Left heart catheterization.  2. Selective coronary angiography.  3. Left ventriculography.  4. Aortic root injection.   ACCESSING EQUIPMENT:  The area about the right femoral artery was  anesthetized with 1% percent lidocaine and a 4-French sheath was placed  in the right femoral artery via the modified Seldinger technique.  A  standard preformed 4-French JL-4 and JR-4 catheters were used for  selective coronary geography; and an angled pigtail catheter was used  for left heart catheterization and left ventriculography.  All exchanges  were made over wire.  A total of 100 mL Omnipaque was used.  No  complications were noted.   HEMODYNAMIC RESULTS:  Aorta 124/76 mmHg.  Left ventricle 123/14 mmHg.   ANGIOGRAPHIC FINDINGS:  1. The left main coronary artery is relatively short and gives rise to      the left anterior descending and circumflex vessels.  No      significant flow-limiting coronary atherosclerosis is noted.  2. The left  anterior descending is a small caliber vessel relative to      catheter size.  There are three diagonal branches.  The more      proximal branches being the largest.  No significant flow-limiting      coronary atherosclerosis is noted.  3. The circumflex vessel is a medium caliber dominant vessel,      providing the posterior descending branch which is relatively      small.  Otherwise, there are four obtuse marginal branches.  The      most proximal of which is nearly a ramus intermedius branch; and is      quite large and bifurcating.  No significant flow-limiting coronary      atherosclerosis is noted.  4. The right coronary artery is a small nondominant vessel with three      branches; two of which are small right ventricular marginal      branches.  No obvious flow-limiting coronary atherosclerosis is      noted.   Left ventriculography was performed in the RAO projection and reveals an  ejection fraction of  approximately 55% with no significant mitral  regurgitation.   Aortic root injection was obtained in the LAO projection; and revealed  trivial aortic regurgitation.  No obvious aortic dissection is noted.  The aortic valve, proper, is somewhat hard to visualize but appears to  be trileaflet.   DIAGNOSES:  1. No significant flow-limiting coronary atherosclerosis noted within      the major epicardial vessels.  The coronary system is left dominant      with a small right coronary artery.  2. Left ventricular ejection fraction is approximately 55% with no      significant mitral regurgitation and a left ventricle end-diastolic      pressure of 14 mmHg.  3. No obvious aortic root dissection visualized.  There is trace      aortic regurgitation.   DISCUSSION:  I reviewed the results with the patient his wife, and his  brother.  At this point would anticipate aggressive risk factor  modification strategies.  Will also schedule a follow-up chest CT scan  to exclude other  possible causes of chest pain including thromboembolic  disease and/or other vascular disease; will then follow up in the [name]  office.      Jonelle Sidle, MD  Electronically Signed     SGM/MEDQ  D:  07/16/2006  T:  07/16/2006  Job:  [id number]   cc:   Baker Janus, MD

## 2010-08-17 NOTE — H&P (Signed)
NAME:  Wesley [name], Wesley [name] NO.:  [id number]   MEDICAL RECORD NO.:  [id number]          PATIENT TYPE:  AMB   LOCATION:                                FACILITY:  [facility]   PHYSICIAN:  Lionel December, M.D.    DATE OF BIRTH:  23-Jul-1960   DATE OF ADMISSION:  DATE OF DISCHARGE:  LH                                HISTORY & PHYSICAL   PRESENTING COMPLAINT:  Persistent dysphagia to solids.   HISTORY OF PRESENT ILLNESS:  Wesley [name] is a 50 year old Caucasian male patient of  Dr. Sherril Croon who presented on the day of Christmas with a foreign body on his  esophagus.  He had an esophagoscopy with foreign body removal and esophageal  dilation.  He had erosive esophagitis with soft stricture at the GE  junction, which was dilated to 19 mm.  He was also noted to have a small  sliding hiatal hernia.  He was begun on omeprazole and asked to follow anti-  reflux measures.  He states his heartburn is well controlled but dysphagia  is not.  He has had multiple episodes where he has difficulty swallowing  solids.  He points to his lower sternal area as the site of bolus  obstruction.  On one occasion, he had to regurgitate food to get relief;  otherwise, it eventually passes down.  He denies hoarseness, sore throat or  chronic cough.  He has lost a few pounds in the last year.  He feels it is  because of his dysphagia.  He denies abdominal pain, melena or rectal  bleeding.   Wesley [name] has a history of GERD for many years.  He had an EGD with ED performed  in March 1990.  He had Schatzki's ring with small sliding hiatal hernia and  margins of reflux esophagitis.  He also had erosive gastritis and  duodenitis, but his CLOtest was negative.  He stayed on a PPI for years, but  decided to stop it 1 year ago.  He does smoke sporadically, but chews  tobacco all the time.   CURRENT MEDICATIONS:  1.  Toprol-XL 50 mg daily.  2.  Omeprazole 20 mg p.o. q.a.m.   PAST MEDICAL HISTORY:  1.  Chronic GERD.  2.  Left  inguinal herniorrhaphy several years ago.  3.  He had an injury to his right middle finger in 1986 with loss of the      distal phalanx.  4.  He was also diagnosed to have a leaky valve.  He does not know the      details, and he has a history of kidney stones.  He has passed 3 stones.   ALLERGIES:  No known drug allergies.   FAMILY HISTORY:  Father died of CHF at age 65.  Mother has been treated for  non-Hodgkin's lymphoma and remains in remission.  He has two brothers; one  has CAD and had a CABG, and the other one had surgery for valvular disease.   SOCIAL HISTORY:  He is married.  He has 1 son.  He presently works at  [employer].  He has 3 children.  He chews tobacco and smokes cigarettes, no  more than a pack a week.  He drinks alcohol very occasionally.   OBJECTIVE:  GENERAL:  Well-developed, thin Caucasian male who is in no acute  distress.  VITAL SIGNS:  He weighs 181 pounds.  He is 5 feet 10 inches tall.  Pulse 72  per minute, blood pressure 124/82, temperature 97.8.  HEENT:  Conjunctivae is pink.  Sclerae is nonicteric.  Oropharyngeal mucosa  is normal.  A few teeth are missing.  The rest of the dentition is in  satisfactory condition.  NECK:  No neck masses or thyromegaly are noted.  CARDIAC:  Regular rhythm.  Normal S1 and S3.  No murmur noted.  LUNGS:  Clear to auscultation.  ABDOMEN:  Flat and soft without organomegaly or masses.  RECTAL:  Deferred.  No peripheral edema or clubbing noted.   ASSESSMENT:  Wesley [name] has a several year history of gastroesophageal reflux  disease.  He recently presented with a foreign body in his esophagus and  underwent EGD on Christmas day with the removal of a foreign body and  esophageal dilation.  He is still having dysphagia.  I suspect he has  recurrent stricture, possibly from scarring.  He needs to have esophagus re-  dilated.   RECOMMENDATIONS:  Will increase his omeprazole to 20 mg p.o. b.i.d. until  EGD with ED in about 2  weeks.   He will need SBE prophylaxis.      Lionel December, M.D.  Electronically Signed     NR/MEDQ  D:  04/18/2005  T:  04/19/2005  Job:  [id number]

## 2010-08-17 NOTE — Op Note (Signed)
NAMEJANIEL, CRISOSTOMO NO.:  [id number]   MEDICAL RECORD NO.:  [id number]          PATIENT TYPE:  OIB   LOCATION:  3172                         FACILITY:  [facility]   PHYSICIAN:  Coletta Memos, M.D.     DATE OF BIRTH:  December 14, 1960   DATE OF PROCEDURE:  08/01/2006  DATE OF DISCHARGE:                               OPERATIVE REPORT   PREOPERATIVE DIAGNOSIS:  Left L2 radiculopathy secondary to foraminal  narrowing.   POSTOPERATIVE DIAGNOSIS:  Left L2 radiculopathy secondary to foraminal  narrowing.   PROCEDURE:  Left L2-3 far lateral diskectomy with microdissection, redo.   INDICATIONS:  Wesley [name] is a 45-year gentleman who underwent surgery  done by myself in the past for a far lateral disk at L2-3 on the left  side.  He did well initially but then over time just developed more and  more pain.  A myelogram was performed.  It showed that there was  possibly some soft tissue, some bony osteophytes laterally.  At the  initial operation I recounted that he had osteophytes at that level.  I  therefore offered and he agreed to undergo decompression of the L2-3  root.   OPERATIVE NOTE:  Mr. Gardella was brought to the operating room, intubated  and placed under general anesthetic without difficulty.  He was rolled  prone onto a Wilson frame and all pressure points were properly padded.  His back was prepped and he was draped in a sterile fashion.  I  infiltrated 10 mL 0.5% lidocaine with 1:200,000 strength epinephrine  into the old incision.  I opened the skin with a #10 blade and I took  this down to the thoracolumbar fascia.  I then exposed to laminae and  those turned out to be L3 and L2, but I had placed a double-ended  ganglion knife inferior to the lamina of L3.  I then moved up one level,  took another x-ray and showed I was in the correct position.  I then  proceeded to remove the soft tissue overlying the neural foramen on the  left side at L2.  I was able to take  off soft tissue, fully expose the  pars interarticularis of L2 on the left side and the superior facette of  L2.  I then removed that soft tissue, clearly defined the L2 pedicle.  I  then retracted the nerve root rostrally and entered the disk space at  that level.  After doing the diskectomy with Dr. Temple Pacini assistance, I  just was not able to appreciate any significant compression.  There was  no compression that I could see or feel in the neural foramen on the  left side.  The CT in a myelogram notwithstanding, the nerve root  appeared to be very well-decompressed.  I was out beyond the facette in  terms of further inspection, and all felt well.  I then placed Solu-  Medrol over the nerve root.  I did use the microscope to aid in  microdissection in and around the nerve root and the disk space.  A  diskectomy was performed at the L2-3 level and I also went medially as  there appeared to be some fullness of the disk on the CT mild post-  myelogram films medially in the foramen.  I then closed the wound in a  layered fashion.  Mr. Loeper tolerated the procedure well.           ______________________________  Coletta Memos, M.D.     KC/MEDQ  D:  08/01/2006  T:  08/01/2006  Job:  [id number]

## 2010-08-17 NOTE — Assessment & Plan Note (Signed)
[facility]                          [facility] NOTE   NAME:[name], Wesley [name]                         MRN:          [id number]  DATE:07/10/2006                            DOB:          April 15, 1960    REASON FOR CONSULTATION:  Wesley [name] is a 50 year old male, with no  documented prior history of coronary artery disease, but with numerous  cardiac risk factors, now referred to Dr. Simona [name] for evaluation of  progressive, exertional chest discomfort.   Patient has cardiac risk factors notable for hypertension, longstanding  tobacco smoking, hyperlipidemia, family history of premature coronary  artery disease and age.   Patient reports development of new onset exertional chest discomfort  over the last few months, which seems to have become progressive in  terms of intensity and duration, and almost always is associated with  work.  He has also experienced chest discomfort when walking up an  incline or a flight of stairs, and reports relief within 5 minutes or  so.  The discomfort is localized over the left side of the chest with  radiation to behind the left shoulder and down into the left bicep.  He  has associated feelings of weakness, but no nausea, diaphoresis, or  dyspnea.   Patient has a very physically strenuous job, but denies any recent  traumatic injury to the chest wall.  He has also noted some exertional  dyspnea over these past several weeks as well.   Patient was recently seen at [facility] ER, and had one set of normal  cardiac markers.  Chest x-ray was normal, and he was discharged on  Lortab.   A 2D echo was also done, per Dr. Sherril Croon, revealing normal left ventricular  function, with mild aortic regurgitation.   Electrocardiogram today reveals normal sinus rhythm at 72 BPM with  normal axis and no ischemic changes.   ALLERGIES:  NO KNOWN DRUG ALLERGIES.   CURRENT MEDICATIONS:  1. Toprol XL 50 q.d.  2. Omeprazole  20 q.d.   PAST MEDICAL HISTORY:  1. Hypertension.  2. Hyperlipidemia.  3. Gastroesophageal reflux disease/hiatal hernia.  4. Esophageal stricture.      a.     Status post multiple dilatations (last December 2007, by Dr.       Karilyn [name]).  5. History of palpitations.   SURGICAL HISTORY:  1. Status post lumbar laminectomy March 2007, by Dr. Coletta [name].  2. 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  He also works part-time for the [employer], systems.  He has been smoking at least a pack a day for the past 15  years or so.  Denies alcohol use.   FAMILY HISTORY:  Brother age 58, status post MI at age 68.  Father  deceased, age 37, history of congestive heart failure.  Another brother  is status post aortic valve replacement at age 37, secondary to bicuspid  valve.   REVIEW OF SYMPTOMS:  Denies history  of diabetes mellitus, has occasional  heartburn which is stable on proton pump inhibitor, and occasional  palpitations, otherwise as per HPI, remaining systems negative.   PHYSICAL EXAMINATION:  VITAL SIGNS:  Blood pressure 132/87, pulse 80,  regular weight 178.  GENERAL:  [name] sitting upright, in no distress.  HEENT:  Normocephalic, atraumatic.  NECK:  Palpable bilateral carotid pulses without bruits; no JVD.  LUNGS:  Clear to auscultation in all fields.  HEART:  Regular rate and rhythm (S1, S2), no murmurs, rubs or gallops.  No diastolic flow appreciated.  ABDOMEN:  Soft, nontender, intact bowel sounds.  EXTREMITIES:  Palpable femoral pulses without bruits; intact distal  pulses without edema.  NEURO:  No focal deficits.   IMPRESSION:  1. Exertional chest discomfort.      a.     New onset.  2. Multiple cardiac risk factors.      a.     Hypertension.      b.     Tobacco.      c.     Hyperlipidemia.      d.     Wesley [name] family history.      e.      Age.  3. Gastroesophageal reflux disease.  4. Normal left ventricular function.      a.     Mild aortic regurgitation, by recent 2D echo.   PLAN:  Patient presents with symptoms which are worrisome for  significant underlying coronary artery disease, particularly in light of  multiple cardiac risk factors.  Therefore, recommendation is to proceed  with diagnostic cardiac catheterization, to be scheduled in our [facility] lab.  Patient is quite agreeable with this plan and the risks/benefits of the  procedure have been discussed.  He  will be started on full-dose aspirin and continue on current dose of  beta blocker.  In addition, we will provide him with nitroglycerin to be  used on an as-needed basis.      Rozell Searing, PA-C  Electronically Signed      Jonelle Sidle, MD  Electronically Signed   GS/MedQ  DD: 07/10/2006  DT: 07/10/2006  Job #: [id number]   cc:   Doreen Beam

## 2010-08-17 NOTE — Op Note (Signed)
Wesley [name], NESBIT NO.:  [id number]   MEDICAL RECORD NO.:  [id number]          PATIENT TYPE:  AMB   LOCATION:  DAY                           FACILITY:  [facility]   PHYSICIAN:  Lionel December, M.D.    DATE OF BIRTH:  Dec 13, 1960   DATE OF PROCEDURE:  05/10/2005  DATE OF DISCHARGE:                                 OPERATIVE REPORT   PROCEDURE:  Esophagogastroduodenoscopy with esophageal dilation.   INDICATION:  Wesley [name] is a 50 year old Caucasian male with long history of GERD  who has not been on maintenance therapy who presented on Christmas Day with  foreign body in esophagus. He was noted to have erosive esophagitis with  stricture which was dilated to 19 mm. He has been maintained on double dose  PPI. States his heartburn is much better, but he still having intermittent  solid food dysphagia. Therefore, it is suspected he may have scarred down.  He is therefore undergoing repeat exam. He was given SBE prophylaxis with  ampicillin and gentamicin for history of leaky valve.   Procedure and risks were reviewed with the patient, and informed consent was  obtained.   MEDICINES FOR CONSCIOUS SEDATION:  Cetacaine spray for pharyngeal topical  anesthesia, Demerol 50 mg IV, Versed 14 mg IV.   FINDINGS:  Procedure performed in the endoscopy suite. The patient's vital  signs and O2 saturation were monitored during the procedure and remained  stable. The patient was placed in left lateral position, and Olympus  videoscope was passed via oropharynx without any difficulty into esophagus.   Esophagus. Mucosa of the esophagus was normal except a 4-mm sessile polyp  which was easily ablated via cold biopsy. It was at 30 cm from the incisors.  Erosions had completely healed. There was a ring. GE junction was at 39 cm  and hiatus was at 41. He had small sliding hiatal hernia.   Stomach. It was empty and distended very well with insufflation. Folds of  proximal stomach were normal.  Examination of mucosa at body, antrum, pyloric  channel as well as angularis, fundus and cardia was normal.   Duodenum. Bulbar mucosa was normal. Scope was passed into the second part of  duodenum where mucosa and folds were normal. Endoscope was withdrawn   Endoscope was pulled back into the stomach. Balloon dilator was passed with  the scope. Guidewire was pushed in the gastric lumen. Endoscope was pulled  in the body of esophagus, and balloon dilator was positioned across  Schatzki's ring. Balloon was insufflated to a diameter of 19 mm. The ring  was still intact. However, increasing in the diameter 20 mm, this ring was  disrupted. Balloon was deflated and withdrawn. Pictures taken of the distal  esophagus. Endoscope was withdrawn. The patient tolerated the procedure  well.   FINAL DIAGNOSIS:  1.  Esophagitis has completely healed. He has distal esophageal ring which      was disrupted by dilating to 20 mm with balloon.  2.  Small sliding hiatal hernia.  3.  Small sessile polyp ablated via cold biopsy from the  body of esophagus.  4.  Normal exam reached the stomach, first and second part of duodenum.   RECOMMENDATIONS:  1.  Anti-reflux measures as before.  2.  He will continue omeprazole at 20 mg b.i.d. for 8 weeks, thereafter once      a day.  3.  I will be contacting the patient with biopsy results. I would like from      him to return for OV in one year from now unless he still has dysphagia.      Lionel December, M.D.  Electronically Signed     NR/MEDQ  D:  05/10/2005  T:  05/11/2005  Job:  [id number]   cc:   Doreen Beam  Fax: [phone]

## 2010-08-17 NOTE — Op Note (Signed)
Wesley [name], Wesley [name] NO.:  [id number]   MEDICAL RECORD NO.:  [id number]          PATIENT TYPE:  INP   LOCATION:  A211                          FACILITY:  [facility]   PHYSICIAN:  Lionel December, M.D.    DATE OF BIRTH:  11-12-1960   DATE OF PROCEDURE:  03/25/2005  DATE OF DISCHARGE:                                 OPERATIVE REPORT   PROCEDURE:  Esophagoscopy with foreign body removal followed by completion  of exam and esophageal dilation.   INDICATIONS:  [name] is a73 year old Caucasian male who presents with signs and  symptoms of esophageal foreign body. Symptoms began 6:00 p.m. yesterday when  he was eating roast beef, etc. He waited for eight hours, and he went over  to ER at the [facility] and subsequently transferred here. He has been having  frequent heartburn, but he is not taking a PPI anymore. He has been having  intermittent solid food dysphagia for about a year.   Procedure and risks were reviewed with the patient, and informed consent was  obtained.   MEDICINES FOR CONSCIOUS SEDATION:  Cetacaine spray for pharyngeal topical  anesthesia, Demerol 75 mg IV, Versed 5 mg IV.   FINDINGS:  Procedure performed in endoscopy suite. The patient's vital signs  and O2 saturation were monitored during the procedure and remained stable.  The patient was placed in left lateral position, and Olympus videoscope was  passed oropharynx without any difficulty into esophagus.   There was impacted foreign body, i.e. meat in distal esophagus. This was  caught with snare and easily retrieved. Endoscope was passed again. A  smaller piece was easily pushed into the stomach. There was stricture at GE  junction which was at 38 cm. There were a few small erosions just proximal  to GE junction. Hiatus was at 40. Scope was passed across the stricture  without any difficulty. The stricture was dilated after completion of exam  using a balloon, initially to 18 and then to 19 mm.   Stomach. It  was empty and distended very well with insufflation. Folds of  the proximal stomach were normal. Examination mucosa at body, antrum,  pyloric channel as well as angularis, fundus and cardia was normal.   Duodenum. Bulbar mucosa was normal. Postbulbar mucosa and folds were also  normal. Endoscope was pulled back into the stomach.   Esophageal stricture was dilated by placing a balloon across it and  gradually insufflating to a diameter of 18 mm. I was able to advance the  balloon distally without any difficulty. The stricture was then dilated to  19 mm. There was small tear at 4 o'clock. Pictures were taken for the  record. Endoscope was withdrawn. The patient tolerated the procedure well.   FINAL DIAGNOSIS:  1.  Impacted foreign body esophagus which was removed as described above.  2.  Erosive esophagitis with stricture at GE junction which was dilated to      19 mm.  3.  Small sliding hiatal hernia.   RECOMMENDATIONS:  1.  He will be going home later today.  2.  Anti-reflux measures.  3.  Omeprazole 20 mg p.o. q.a.m., prescription written for 30 with 11      refills.  4.  He should return for OV in one year. However, if his heartburn is not      well-controlled  or dysphagia recurs, he should call to make an      appointment sooner.      Lionel December, M.D.  Electronically Signed     NR/MEDQ  D:  03/25/2005  T:  03/25/2005  Job:  [id number]

## 2010-12-20 LAB — BASIC METABOLIC PANEL
Chloride: 103
Creatinine, Ser: 1.02
GFR calc Af Amer: >60
Sodium: 140

## 2010-12-20 LAB — DIFFERENTIAL
Eosinophils Absolute: 0.2
Lymphs Abs: 2
Monocytes Relative: 6
Neutro Abs: 3.6
Neutrophils Relative %: 58

## 2010-12-20 LAB — URINALYSIS, ROUTINE W REFLEX MICROSCOPIC
Bilirubin Urine: NEGATIVE
Glucose, UA: NEGATIVE
Ketones, ur: NEGATIVE
Leukocytes, UA: NEGATIVE
pH: 5.5

## 2010-12-20 LAB — CBC
Hemoglobin: 13.6
MCV: 90.3
RBC: 4.44
WBC: 6.2

## 2010-12-20 LAB — URINE MICROSCOPIC-ADD ON

## 2012-07-12 ENCOUNTER — Emergency Department (HOSPITAL_COMMUNITY): Payer: Medicare HMO

## 2012-07-12 ENCOUNTER — Emergency Department (HOSPITAL_COMMUNITY)
Admission: EM | Admit: 2012-07-12 | Discharge: 2012-07-13 | Disposition: A | Discharge status: Home / Self Care | Payer: Medicare HMO | Attending: Emergency Medicine | Admitting: Emergency Medicine

## 2012-07-12 ENCOUNTER — Encounter (HOSPITAL_COMMUNITY): Payer: Self-pay | Admitting: *Deleted

## 2012-07-12 DIAGNOSIS — Z862 Personal history of diseases of the blood and blood-forming organs and certain disorders involving the immune mechanism: Secondary | ICD-10-CM | POA: Insufficient documentation

## 2012-07-12 DIAGNOSIS — Z87891 Personal history of nicotine dependence: Secondary | ICD-10-CM | POA: Insufficient documentation

## 2012-07-12 DIAGNOSIS — R42 Dizziness and giddiness: Secondary | ICD-10-CM | POA: Insufficient documentation

## 2012-07-12 DIAGNOSIS — Z79899 Other long term (current) drug therapy: Secondary | ICD-10-CM | POA: Insufficient documentation

## 2012-07-12 DIAGNOSIS — R0602 Shortness of breath: Secondary | ICD-10-CM | POA: Insufficient documentation

## 2012-07-12 DIAGNOSIS — Z8639 Personal history of other endocrine, nutritional and metabolic disease: Secondary | ICD-10-CM | POA: Insufficient documentation

## 2012-07-12 DIAGNOSIS — R002 Palpitations: Secondary | ICD-10-CM | POA: Insufficient documentation

## 2012-07-12 DIAGNOSIS — I4891 Unspecified atrial fibrillation: Secondary | ICD-10-CM | POA: Insufficient documentation

## 2012-07-12 DIAGNOSIS — K219 Gastro-esophageal reflux disease without esophagitis: Secondary | ICD-10-CM | POA: Insufficient documentation

## 2012-07-12 DIAGNOSIS — E876 Hypokalemia: Secondary | ICD-10-CM | POA: Insufficient documentation

## 2012-07-12 DIAGNOSIS — I10 Essential (primary) hypertension: Secondary | ICD-10-CM | POA: Insufficient documentation

## 2012-07-12 LAB — CBC WITH DIFFERENTIAL/PLATELET
Eosinophils Absolute: 0.1 10*3/uL (ref 0.0–0.7)
Eosinophils Relative: 2 % (ref 0–5)
Hemoglobin: 15.8 g/dL (ref 13.0–17.0)
Lymphs Abs: 2.3 10*3/uL (ref 0.7–4.0)
MCH: 30.4 pg (ref 26.0–34.0)
MCV: 89.6 fL (ref 78.0–100.0)
Monocytes Relative: 5 % (ref 3–12)
RBC: 5.2 MIL/uL (ref 4.22–5.81)

## 2012-07-12 LAB — BASIC METABOLIC PANEL
BUN: 13 mg/dL (ref 6–23)
Calcium: 9.7 mg/dL (ref 8.4–10.5)
Creatinine, Ser: 1.29 mg/dL (ref 0.50–1.35)
GFR calc non Af Amer: 63 mL/min — ABNORMAL LOW (ref >90)
Glucose, Bld: 127 mg/dL — ABNORMAL HIGH (ref 70–99)

## 2012-07-12 MED ORDER — POTASSIUM CHLORIDE CRYS ER 20 MEQ PO TBCR
40.0000 meq | EXTENDED_RELEASE_TABLET | Freq: Once | ORAL | Status: AC
  Administered 2012-07-12: 40 meq via ORAL
  Filled 2012-07-12: qty 2

## 2012-07-12 MED ORDER — DILTIAZEM HCL 25 MG/5ML IV SOLN
10.0000 mg | Freq: Once | INTRAVENOUS | Status: AC
  Administered 2012-07-12: 10 mg via INTRAVENOUS
  Filled 2012-07-12: qty 5

## 2012-07-12 MED ORDER — POTASSIUM CHLORIDE 10 MEQ/100ML IV SOLN
10.0000 meq | Freq: Once | INTRAVENOUS | Status: AC
  Administered 2012-07-12: 10 meq via INTRAVENOUS
  Filled 2012-07-12: qty 100

## 2012-07-12 MED ORDER — SODIUM CHLORIDE 0.9 % IV SOLN
Freq: Once | INTRAVENOUS | Status: AC
  Administered 2012-07-12: 23:00:00 via INTRAVENOUS

## 2012-07-12 MED ORDER — DILTIAZEM HCL 25 MG/5ML IV SOLN
10.0000 mg | Freq: Once | INTRAVENOUS | Status: AC
  Administered 2012-07-12: 10 mg via INTRAVENOUS

## 2012-07-12 NOTE — ED Notes (Signed)
Pt c/o tachycardia x 1hr and states he got up off the couch and felt faint, like he was going to drop dead. He also was sob.

## 2012-07-12 NOTE — ED Provider Notes (Signed)
History  This chart was scribed for Wesley Octave, MD by Greggory Stallion, ED Scribe. This patient was seen in room APA09/APA09 and the patient's care was started at 10:07 PM.  CSN: [id number]  Arrival date & time 07/12/12  2128   None     Chief Complaint  Patient presents with  . Tachycardia    The history is provided by the patient. No language interpreter was used.   Wesley [name] is a 52 y.o. male with a h/o A. Fib. who presents to the Emergency Department complaining of sudden onset, non-changing, constant palpitations described as a fast heart beat with associated lightheadedness and SOB that started while at rest 30 minutes PTA. HR was 135 upon arrival. He denies any known initiating factors. Pt's wife states that he has an episode at work earlier this week that resolved on its own. He was not evaluated at the time. Pt is currently on bystolic for HTN and Eloquest for the A. Fib which he states is not chronic. He states that his last stress test was in 2012. He reports that he recently switched to Androgel but denies any other medication increases or changes. He denies fever, neck pain, sore throat, visual disturbance, CP, cough, abdominal pain, nausea, emesis, diarrhea, urinary symptoms, dizziness, back pain, HA, weakness, numbness and rash as associated symptoms. He also has a h/o GERD and HLD. He is a former smoker but denies alcohol use.  Cardiologist is Dr. Diona Browner  Past Medical History  Diagnosis Date  . Hypertension   . Hyperlipidemia   . GERD (gastroesophageal reflux disease)   . Esophageal stricture     Multiple dilations  . Atrial fibrillation     CHADS2 score 1    Past Surgical History  Procedure Laterality Date  . Lumbar luminectomy  2007    Dr. Coletta Memos  . Partial finger amputation      Family History  Problem Relation Age of Onset  . Heart failure Father     Died age 39  . Heart attack Brother     Age 63  . Aortic stenosis Brother     Bicuspid  valve status post AVR    History  Substance Use Topics  . Smoking status: Former Smoker -- 0.50 packs/day for 15 years    Types: Cigarettes    Quit date: 12/30/2008  . Smokeless tobacco: Current User    Types: Snuff     Comment: dips 2 cans/snuff per week  . Alcohol Use: No      Review of Systems  A complete 10 system review of systems was obtained and all systems are negative except as noted in the HPI and PMH.   Allergies  Review of patient's allergies indicates no known allergies.  Home Medications   Current Outpatient Rx  Name  Route  Sig  Dispense  Refill  . nebivolol (BYSTOLIC) 10 MG tablet   Oral   Take 10 mg by mouth daily.           . pantoprazole (PROTONIX) 40 MG tablet   Oral   Take 40 mg by mouth daily.         Marland Kitchen PRESCRIPTION MEDICATION   Oral   Take 1 tablet by mouth 2 (two) times daily.         . Testosterone (ANDROGEL) 20.25 MG/1.25GM (1.62%) GEL   Transdermal   Place 1 application onto the skin daily.           Triage Vitals:  BP 154/92  Pulse 135  Temp(Src) 97.1 F (36.2 C) (Oral)  Resp 20  Ht 5\' 11"  (1.803 m)  Wt 200 lb (90.719 kg)  BMI 27.91 kg/m2  SpO2 98%  Physical Exam  Nursing note and vitals reviewed. Constitutional: He is oriented to person, place, and time. He appears well-developed and well-nourished. No distress.  HENT:  Head: Normocephalic and atraumatic.  Mouth/Throat: Oropharynx is clear and moist.  Eyes: Conjunctivae and EOM are normal. Pupils are equal, round, and reactive to light.  Neck: Neck supple. No tracheal deviation present.  Cardiovascular: An irregularly irregular rhythm present. Tachycardia present.   No murmur heard. HR is 105-115  Pulmonary/Chest: Effort normal and breath sounds normal. No respiratory distress.  Abdominal: Soft. There is no tenderness.  Musculoskeletal: Normal range of motion.  Neurological: He is alert and oriented to person, place, and time.  Skin: Skin is warm and dry.   Psychiatric: He has a normal mood and affect. His behavior is normal.    ED Course  Procedures (including critical care time)  Medications  potassium chloride 10 mEq in 100 mL IVPB (10 mEq Intravenous New Bag/Given 07/12/12 2357)  diltiazem (CARDIZEM) injection 10 mg (0 mg Intravenous Stopped 07/12/12 2329)  0.9 %  sodium chloride infusion ( Intravenous New Bag/Given 07/12/12 2314)  diltiazem (CARDIZEM) injection 10 mg (0 mg Intravenous Stopped 07/12/12 2337)  potassium chloride SA (K-DUR,KLOR-CON) CR tablet 40 mEq (40 mEq Oral Given 07/12/12 2357)    DIAGNOSTIC STUDIES: Oxygen Saturation is 98% on RA, normal by my interpretation.    COORDINATION OF CARE: 10:12 PM-Discussed treatment plan which includes medications, EKG, CBC, BMP, and protime-INR with pt at bedside and pt agreed to plan.   Labs Reviewed  BASIC METABOLIC PANEL - Abnormal; Notable for the following:    Potassium 3.0 (*)    Glucose, Bld 127 (*)    GFR calc non Af Amer 63 (*)    GFR calc Af Amer 73 (*)    All other components within normal limits  CBC WITH DIFFERENTIAL  PROTIME-INR  TROPONIN I   Dg Chest 2 View  07/13/2012  *RADIOLOGY REPORT*  Clinical Data: Palpitations.  Tachycardia.  CHEST - 2 VIEW  Comparison: 07/29/2006  Findings: The heart size and pulmonary vascularity are normal and the lungs are clear.  No significant osseous abnormality.  IMPRESSION: Normal chest, unchanged.   Original Report Authenticated By: Francene Boyers, M.D.      1. Atrial fibrillation   2. Hypokalemia       MDM  History of atrial fibrillation with one hour of palpitations and shortness of breath. No chest pain. On eliquis.   HR improved to 70s, atrial fibrillation after cardizem. Denies SOB, CP. Palpitations have resolved. Denies dizziness or lightheadedness.   Potassium is low and possibly contributing to arrhythmia. It is repleted. Patient instructed to continue his Bystolic and followup with his cardiologist this week.    Date: 07/12/2012  Rate: 130  Rhythm: atrial fibrillation  QRS Axis: normal  Intervals: normal  ST/T Wave abnormalities: nonspecific ST/T changes  Conduction Disutrbances:none  Narrative Interpretation: now rapid A fib  Old EKG Reviewed: changes noted   I personally performed the services described in this documentation, which was scribed in my presence. The recorded information has been reviewed and is accurate.     Wesley Octave, MD 07/13/12 [phone]

## 2012-07-14 ENCOUNTER — Ambulatory Visit (INDEPENDENT_AMBULATORY_CARE_PROVIDER_SITE_OTHER): Payer: Medicare HMO | Admitting: Adult Health

## 2012-07-14 ENCOUNTER — Encounter: Payer: Self-pay | Admitting: Adult Health

## 2012-07-14 VITALS — BP 101/74 | HR 99 | Ht 71.0 in | Wt 197.5 lb

## 2012-07-14 DIAGNOSIS — I359 Nonrheumatic aortic valve disorder, unspecified: Secondary | ICD-10-CM

## 2012-07-14 DIAGNOSIS — I4891 Unspecified atrial fibrillation: Secondary | ICD-10-CM

## 2012-07-14 DIAGNOSIS — I10 Essential (primary) hypertension: Secondary | ICD-10-CM

## 2012-07-14 MED ORDER — FLECAINIDE ACETATE 50 MG PO TABS: 50.0000 mg | ORAL_TABLET | Freq: Two times a day (BID) | ORAL | Status: DC

## 2012-07-14 NOTE — Assessment & Plan Note (Signed)
He had sudden onset of atrial fibrillation at rest prompting ER visit, heart rate slowed down with IV Cardizem 10 mg, and has been placed on 180 mg of diltiazem daily per Dr. Ouida Sills. He is also on anticoagulation. He continues to feel pressure in his chest and heaviness in his arms and is able to tell that his heart rate is irregular.  I have discussed the case with Dr. Diona Browner who has also seen and evaluated this patient. Review of prior records demonstrates the patient had a cardiac catheterization in 2008 revealing no cardiac disease.  After discussion with Dr. Diona Browner, it is his recommendation the patient placed on flecainide 50 mg by mouth twice a day, followup with a GXT in one week. I will also check a BMET to evaluate potassium status as he was found to be hypokalemic in the emergency room. He will see Dr. Diona Browner again in one month for ongoing assessment. He may be able to perform a cardioversion if necessary at as he will have been on anticoagulation for 4 weeks. Please see Dr. Diona Browner is an addendum to this note.

## 2012-07-14 NOTE — Progress Notes (Signed)
   HPI: [name] is a 52 year old patient of Dr. Diona Browner were following for ongoing assessment and treatment of atrial fibrillation, hypertension, with a history of hyperlipidemia and aortic valve disease. He was last seen by Dr. Diona Browner on 07/16/2010, but was recently seen in the emergency room on 07/12/2012 with complaints of palpitations. He was found to be hypokalemic with potassium of 3.0, also in atrial fib with RVR, heart rate of 135 beats per minute. Other lab results were unremarkable. He was given Cardizem bolus of 10 mg and was given potassium replacement with normal his heart rate in the 70s but continued atrial fibrillation. He is here for close followup.   He was seen by Dr. Ouida Sills yesterday, placed on diltiazem 180 mg daily, and Eloquis twice a day. He states he still feeling bad, fullness in his chest and heaviness in his arms. He also states that he feels that his blood pressure is too low and he feels weak. He states he can tell that his heart is out of rhythm. He denies racing heart rate however. No Known Allergies  Current Outpatient Prescriptions  Medication Sig Dispense Refill  . diltiazem (CARDIZEM CD) 180 MG 24 hr capsule       . nebivolol (BYSTOLIC) 10 MG tablet Take 10 mg by mouth daily.        . pantoprazole (PROTONIX) 40 MG tablet Take 40 mg by mouth daily.      Marland Kitchen PRESCRIPTION MEDICATION Take 1 tablet by mouth 2 (two) times daily.      . Testosterone (ANDROGEL) 20.25 MG/1.25GM (1.62%) GEL Place 1 application onto the skin daily.      . CRESTOR 10 MG tablet        No current facility-administered medications for this visit.    Past Medical History  Diagnosis Date  . Hypertension   . Hyperlipidemia   . GERD (gastroesophageal reflux disease)   . Esophageal stricture     Multiple dilations  . Atrial fibrillation     CHADS2 score 1    Past Surgical History  Procedure Laterality Date  . Lumbar luminectomy  2007    Dr. Coletta Memos  . Partial finger amputation       EAV:WUJWJX of systems complete and found to be negative unless listed above  PHYSICAL EXAM BP 101/74  Pulse 99  Ht 5\' 11"  (1.803 m)  Wt 197 lb 8 oz (89.585 kg)  BMI 27.56 kg/m2  SpO2 96%  General: Well developed, well nourished, in no acute distress Head: Eyes PERRLA, No xanthomas.   Normal cephalic and atramatic  Lungs: Clear bilaterally to auscultation and percussion. Heart: HRIR S1 S2, tachycardic without MRG.  Pulses are 2+ & equal.            No carotid bruit. No JVD.  No abdominal bruits. No femoral bruits. Abdomen: Bowel sounds are positive, abdomen soft and non-tender without masses or                  Hernia's noted. Msk:  Back normal, normal gait. Normal strength and tone for age. Extremities: No clubbing, cyanosis or edema.  DP +1 Neuro: Alert and oriented X 3. Psych:  Good affect, responds appropriately  EKG: Atrial fibrillation rate of 92 beats per minute  ASSESSMENT AND PLAN

## 2012-07-14 NOTE — Progress Notes (Deleted)
Name: Wesley [name]    DOB: May 31, 1960  Age: 52 y.o.  MR#: [id number]       PCP:  Dwana Melena, MD      Insurance: Payor: [employer]CIAL  Plan: [employer]IC COMMERCIAL  Product Type: *No Product type*    CC:    Chief Complaint  Patient presents with  . Atrial Fibrillation  . Hypertension  PT STATES HE FEELS BAD TODAY, SOB NOTED WHEN NOT DOING ANY ACTIVITY ALL DAY, DENIES CHEST PAIN, BUT FUNNY FEELING PER A-FIB, FEELS THAT HIS ARMS ARE HEAVY, ZACK HALL STARTED HIM ON CARDIZEM 180MG  YESTERDAY, PT TOOK AT 3PM HOWEVER HELD THIS AM DOSAGE PER NOTED BP LOW AS 99/67 WITH HOME CUFF WANTED TO DISCUSS IF NEEDS TO CONTINUE, ALSO NOTES TODAY'S BP BY THIS NURSE IS ABNORMALLY LOW FOR HIM  VS Filed Vitals:   07/14/12 1447  BP: 101/74  Pulse: 99  Height: 5\' 11"  (1.803 m)  Weight: 197 lb 8 oz (89.585 kg)  SpO2: 96%    Weights Current Weight  07/14/12 197 lb 8 oz (89.585 kg)  07/12/12 200 lb (90.719 kg)  07/16/10 193 lb (87.544 kg)    Blood Pressure  BP Readings from Last 3 Encounters:  07/14/12 101/74  07/13/12 112/77  07/16/10 133/80     Admit date:  (Not on file) Last encounter with RMR:  Visit date not found   Allergy Review of patient's allergies indicates no known allergies.  Current Outpatient Prescriptions  Medication Sig Dispense Refill  . diltiazem (CARDIZEM CD) 180 MG 24 hr capsule       . nebivolol (BYSTOLIC) 10 MG tablet Take 10 mg by mouth daily.        . pantoprazole (PROTONIX) 40 MG tablet Take 40 mg by mouth daily.      Marland Kitchen PRESCRIPTION MEDICATION Take 1 tablet by mouth 2 (two) times daily.      . Testosterone (ANDROGEL) 20.25 MG/1.25GM (1.62%) GEL Place 1 application onto the skin daily.      . CRESTOR 10 MG tablet        No current facility-administered medications for this visit.    Discontinued Meds:   There are no discontinued medications.  Patient Active Problem List  Diagnosis  . HYPERLIPIDEMIA-MIXED  . HYPERTENSION, BENIGN  . AORTIC VALVE DISORDERS  . ATRIAL  FIBRILLATION  . PALPITATIONS    LABS    Component Value Date/Time   NA 142 07/12/2012 2230   NA 140 04/08/2007 1643   K 3.0* 07/12/2012 2230   K 3.9 04/08/2007 1643   CL 103 07/12/2012 2230   CL 103 04/08/2007 1643   CO2 26 07/12/2012 2230   CO2 32 04/08/2007 1643   GLUCOSE 127* 07/12/2012 2230   GLUCOSE 97 04/08/2007 1643   BUN 13 07/12/2012 2230   BUN 13 04/08/2007 1643   CREATININE 1.29 07/12/2012 2230   CREATININE 1.02 04/08/2007 1643   CALCIUM 9.7 07/12/2012 2230   CALCIUM 9.2 04/08/2007 1643   GFRNONAA 63* 07/12/2012 2230   GFRNONAA >60 04/08/2007 1643   GFRAA 73* 07/12/2012 2230   GFRAA  Value: >60        The eGFR has been calculated using the MDRD equation. This calculation has not been validated in all clinical situations. eGFR's persistently <60 mL/min signify possible Chronic Kidney Disease. 04/08/2007 1643   CMP     Component Value Date/Time   NA 142 07/12/2012 2230   K 3.0* 07/12/2012 2230   CL 103 07/12/2012 2230  CO2 26 07/12/2012 2230   GLUCOSE 127* 07/12/2012 2230   BUN 13 07/12/2012 2230   CREATININE 1.29 07/12/2012 2230   CALCIUM 9.7 07/12/2012 2230   GFRNONAA 63* 07/12/2012 2230   GFRAA 73* 07/12/2012 2230       Component Value Date/Time   WBC 7.2 07/12/2012 2230   WBC 6.2 04/08/2007 1643   HGB 15.8 07/12/2012 2230   HGB 13.6 04/08/2007 1643   HCT 46.6 07/12/2012 2230   HCT 40.0 04/08/2007 1643   MCV 89.6 07/12/2012 2230   MCV 90.3 04/08/2007 1643    Lipid Panel  No results found for this basename: chol, trig, hdl, cholhdl, vldl, ldlcalc    ABG No results found for this basename: phart, pco2, pco2art, po2, po2art, hco3, tco2, acidbasedef, o2sat     No results found for this basename: TSH   BNP (last 3 results) No results found for this basename: PROBNP,  in the last 8760 hours Cardiac Panel (last 3 results)  Recent Labs  07/12/12 2230  TROPONINI <0.30    Iron/TIBC/Ferritin No results found for this basename: iron, tibc, ferritin     EKG Orders placed in visit on  07/14/12  . EKG 12-LEAD     Prior Assessment and Plan Problem List as of 07/14/2012     ICD-9-CM   HYPERLIPIDEMIA-MIXED   Last Assessment & Plan   07/16/2010 Office Visit Written 07/16/2010  4:01 PM by Jonelle Sidle, MD     LDL recently noted to be in the 140s. Dr. Margo Aye recommended Crestor. Patient had some concerns about cost. It might be worth considering placing him on atorvastatin 10 mg daily (generic) and then rechecking his lipids and liver function tests in the next 12 weeks. He plans to discuss this further with Dr. Margo Aye.    HYPERTENSION, BENIGN   Last Assessment & Plan   07/16/2010 Office Visit Written 07/16/2010  4:05 PM by Jonelle Sidle, MD     Continue regular followup with Dr. Margo Aye.    AORTIC VALVE DISORDERS   Last Assessment & Plan   07/16/2010 Office Visit Written 07/16/2010  4:05 PM by Jonelle Sidle, MD     Mild aortic regurgitation, normal root size.    ATRIAL FIBRILLATION   Last Assessment & Plan   07/16/2010 Office Visit Written 07/16/2010  4:06 PM by Jonelle Sidle, MD     Paroxysmal, CHADS2 score 1. Continue aspirin, beta blocker, observation for now. We did discuss the possibility of antiarrhythmic therapy. Flecainide would be a consideration given reassuring ischemic workup.    PALPITATIONS       Imaging: Dg Chest 2 View  07/13/2012  *RADIOLOGY REPORT*  Clinical Data: Palpitations.  Tachycardia.  CHEST - 2 VIEW  Comparison: 07/29/2006  Findings: The heart size and pulmonary vascularity are normal and the lungs are clear.  No significant osseous abnormality.  IMPRESSION: Normal chest, unchanged.   Original Report Authenticated By: Francene Boyers, M.D.

## 2012-07-14 NOTE — Progress Notes (Signed)
Patient seen and examined with Ms. Lawrence NP. I last saw him in 2012. He has a history of PAF, hypertension, hyperlipidemia, and no significant CAD by previous cardiac catheterization. He was recently seen in the ER related to recurrent atrial fibrillation that has persisted. He is now on combination of Bystolic, Cardizem CD, and Eliquis started by Dr. Ouida Sills yesterday. Onset of symptoms was the day before. He states that he has been having more frequent episodes of palpitations but nothing this prolonged. We discussed the options, and plan at this point to initiate flecainide 50 mg twice daily, hopefully he will soon convert back to sinus rhythm, if not we can schedule a DCCV in 3-4 weeks. GXT will be arranged to exclude proarrhythmia. Office followup arranged.  Jonelle Sidle, M.D., F.A.C.C.

## 2012-07-14 NOTE — Patient Instructions (Addendum)
Your physician recommends that you schedule a follow-up appointment in: ONE MONTH WITH SM  Your physician recommends that you return for lab work in: ONE WEEK ( SLIPS GIVEN-BMET)  Your physician has requested that you have an exercise tolerance test. For further information please visit [email or web address] Please also follow instruction sheet, as given.ONE WEEK AFTER STARTING FLECAINIDE 50MG  TWICE DAILY  Your physician has recommended you make the following change in your medication:   1) START FLECAINIDE 50MG  TWICE DAILY

## 2012-07-14 NOTE — Assessment & Plan Note (Signed)
Recommend followup echocardiogram for reevaluation of his aortic valve, and LV systolic function, along with left atrial size.

## 2012-07-14 NOTE — Assessment & Plan Note (Signed)
Blood pressure is well-controlled. I have rechecked his blood pressure manually in the exam room. Found it to be 136/88. Heart rate obviously is irregular. He is not dizzy or lightheaded.

## 2012-07-15 ENCOUNTER — Ambulatory Visit: Payer: PRIVATE HEALTH INSURANCE | Admitting: Adult Health

## 2012-07-22 ENCOUNTER — Encounter: Payer: Self-pay | Admitting: Cardiology

## 2012-07-22 ENCOUNTER — Ambulatory Visit (HOSPITAL_COMMUNITY)
Admission: RE | Admit: 2012-07-22 | Discharge: 2012-07-22 | Disposition: A | Discharge status: Home / Self Care | Payer: Medicare HMO | Source: Ambulatory Visit | Attending: Adult Health | Admitting: Adult Health

## 2012-07-22 DIAGNOSIS — I4891 Unspecified atrial fibrillation: Secondary | ICD-10-CM | POA: Insufficient documentation

## 2012-07-22 DIAGNOSIS — I10 Essential (primary) hypertension: Secondary | ICD-10-CM | POA: Insufficient documentation

## 2012-07-22 LAB — BASIC METABOLIC PANEL
BUN: 16 mg/dL (ref 6–23)
CO2: 25 mEq/L (ref 19–32)
Chloride: 103 mEq/L (ref 96–112)
Creat: 1.28 mg/dL (ref 0.50–1.35)

## 2012-07-22 NOTE — Progress Notes (Signed)
Stress Lab Nurses Notes - Jiaire Rosebrook  07/22/2012  Reason for doing test: A FIB. 1 wk post Flecanide  Type of test: Regular GTX  Nurse performing test: Parke Poisson, RN  Nuclear Medicine Tech: Not Applicable  Echo Tech: Not Applicable  MD performing test: Ival Bible & Jacolyn Reedy PA  Family MD: Dr. Margo Aye  Test explained and consent signed: yes  IV started: No IV started  Symptoms: None  Treatment/Intervention: None  Reason test stopped: fatigue  After recovery IV was: NA  Patient to return to Nuc. Med at : NA  Patient discharged: Home  Patient's Condition upon discharge was: stable  Comments: During test peak BP 163/77 & HR 120. Recovery BP 108/62 & HR 71. Symptoms resolved in recovery.  Erskine Speed T  Attending note:  Patient in NSR for study. Standard Bruce protocol, achieved 71% MPHR, 13.4 METS, peak blood pressure 163/77. No chest pain. No diagnostic ST abnormalities and no arrhythmias. Nondiagnostic study overall, but reassuring without inducible arrhythmia.  Jonelle Sidle, M.D., F.A.C.C.

## 2012-07-22 NOTE — Progress Notes (Signed)
Stress Lab Nurses Notes - Lenardo Westwood 07/22/2012 Reason for doing test: A FIB.  1 wk post Flecanide Type of test: Regular GTX Nurse performing test: Parke Poisson, RN Nuclear Medicine Tech: Not Applicable Echo Tech: Not Applicable MD performing test: Ival Bible & Jacolyn Reedy PA Family MD: Dr. Margo Aye Test explained and consent signed: yes IV started: No IV started Symptoms: None Treatment/Intervention: None Reason test stopped: fatigue After recovery IV was: NA Patient to return to Nuc. Med at : NA Patient discharged: Home Patient's Condition upon discharge was: stable Comments:  During test peak BP 163/77 & HR 120.  Recovery BP 108/62 & HR 71.  Symptoms resolved in recovery. Erskine Speed T

## 2012-07-23 ENCOUNTER — Encounter: Payer: Self-pay | Admitting: *Deleted

## 2012-07-23 NOTE — Patient Instructions (Signed)
Spoke to pt to advise results/instructions. Pt understood.  

## 2012-08-21 ENCOUNTER — Encounter: Payer: Self-pay | Admitting: Adult Health

## 2012-08-25 ENCOUNTER — Telehealth: Payer: Self-pay | Admitting: *Deleted

## 2012-08-25 NOTE — Telephone Encounter (Signed)
Called pt to clarify sxs and was advised he stared the flecinide and cardizem cd 180 on 07-13-13, noted the fatigue right away after starting, noted the SOB and joints hurting last Wednesday 08-19-12, also notes his hands are shaking as well daily for the past week or two, SOB noted that he can not get his breath fully on a daily basis and the joint aches are described as if he has the flu, as well as fatigue increase towards the end of last week 08-21-12 and felt as if he may be getting a cold and unable to get up and go, pt denies chest pains today, however noted tightness in his chest yesterday, no fever noted, no congestion/coughing, no other new medications/no OTC, pt has not checked BP in a few weeks and unable to check during phone call, pt advised that he would rather not go to the ER if he did not have to, also advised pt to call PCP to advise current sxs per noted pt has been on new medication for over a month now and the only sxs that started directly after was the fatigue,pt understood and will also contact PCP and go to ED if sxs worsen, no apt available today, pt wanted SM to advise his thoughts about sxs,

## 2012-08-25 NOTE — Telephone Encounter (Signed)
Difficult to clarify these symptoms based on a phone call. New medications started in April included Cardizem CD, Eliquis, and subsequently flecainide. It should be noted that he was in sinus rhythm at his presentation for followup GXT a week after starting the flecainide. He should at least have a nurse visit (or be seen by primary MD) to assess his vital signs, make sure that he is not too bradycardic or hypotensive on the current medications, he is also on Bystolic. Search for other possible causes, if none is found, may have to take him off medications one by one to see if any are related.  Quick search of medications: 1. Flecainide sometimes associated with fatigue, dyspnea, palpitations, tremor. No joint pain reported. 2. Bystolic sometimes associated with bradycardia, fatigue, dizziness. No description of tremors or joint pain. 3. Cardizem mainly would be associated with bradycardia, possibly hypotension, dizziness. No description of joint pain. 4. Eliquis would mainly be associated with unusual bleeding.

## 2012-08-25 NOTE — Telephone Encounter (Signed)
PT STATES THAT NEW MEDICATIONS ARE CAUSING HIM TO BE VERY TIRED, JOINTS ARE HURTING AND SOME SOB.   PT IS SCHEDULED TO SEE DR MCDOWELL 08/31/12/TMJ

## 2012-08-25 NOTE — Telephone Encounter (Signed)
Spoke to pt to advise results/instructions. Pt understood. Pt advised he has sent an email to PCP office directly prior to this nurse phone call, pt to call PCP to set up apt today, offered to call for pt, pt stated he would call, offered for a NV today to be seen by this nurse for VS check and EKG, pt declined, however will at least contact our office for the the nurse visit if PCP is unable to see him today, read pt information Dr Dionicia Abler gave below concerning the medications, pt understood and will call office back with any further needs,

## 2012-08-27 NOTE — Telephone Encounter (Signed)
Called pt for update, pt noted he feels better today and did have an apt with PCP Margo Aye and they drew blood and will advise pt once they come back, was advised to stop taking crestor to see if this will help, pt will keep f/u with SM

## 2012-08-31 ENCOUNTER — Encounter: Payer: Self-pay | Admitting: Cardiology

## 2012-08-31 ENCOUNTER — Ambulatory Visit (INDEPENDENT_AMBULATORY_CARE_PROVIDER_SITE_OTHER): Payer: Medicare HMO | Admitting: Cardiology

## 2012-08-31 VITALS — BP 110/80 | HR 64 | Ht 71.0 in | Wt 199.0 lb

## 2012-08-31 DIAGNOSIS — I4891 Unspecified atrial fibrillation: Secondary | ICD-10-CM

## 2012-08-31 DIAGNOSIS — I10 Essential (primary) hypertension: Secondary | ICD-10-CM

## 2012-08-31 NOTE — Patient Instructions (Addendum)
Your physician recommends that you schedule a follow-up appointment in: 4 months  

## 2012-08-31 NOTE — Assessment & Plan Note (Signed)
Continue current medical regimen. His CHADS2 score is one based on hypertension at this point, he is on anticoagulation under the direction of Dr. Margo Aye for now, but we do not plan to pursue cardioversion if medical therapy remains effective. Followup arranged.

## 2012-08-31 NOTE — Progress Notes (Signed)
   Clinical Summary [name] is a 52 y.o.male seen recently in the office by Ms. Lawrence NP in April. At that time he was seen in followup of recently documented persistent atrial fibrillation. Medical therapy including diastolic, Cardizem CD, and Eliquis - initiated by Dr. Margo Aye. We elected to start Flecainide with plan for DCCV if he did not convert spontaneously. Followup low-level GXT did not show any inducible arrhythmias.  Telephone notes reviewed. Patient had called regarding symptoms and questions related to medications. Please refer to my discussion from 5/27. He did see his PCP in the meanwhile and it was advised that he stop Crestor.  He states that he feels much better. Still has brief episodes of palpitations.   No Known Allergies  Current Outpatient Prescriptions  Medication Sig Dispense Refill  . apixaban (ELIQUIS) 5 MG TABS tablet Take 5 mg by mouth 2 (two) times daily.      Marland Kitchen diltiazem (CARDIZEM CD) 180 MG 24 hr capsule Take 180 mg by mouth daily.       . flecainide (TAMBOCOR) 50 MG tablet Take 1 tablet (50 mg total) by mouth 2 (two) times daily.  60 tablet  3  . nebivolol (BYSTOLIC) 10 MG tablet Take 10 mg by mouth daily.        . pantoprazole (PROTONIX) 40 MG tablet Take 40 mg by mouth daily.      . Testosterone (ANDROGEL) 20.25 MG/1.25GM (1.62%) GEL Place 1 application onto the skin daily.       No current facility-administered medications for this visit.    Past Medical History  Diagnosis Date  . Hypertension   . Hyperlipidemia   . GERD (gastroesophageal reflux disease)   . Esophageal stricture     Multiple dilations  . Atrial fibrillation     CHADS2 score 1    Social History Mr. Tignor reports that he quit smoking about 3 years ago. His smoking use included Cigarettes. He has a 7.5 pack-year smoking history. His smokeless tobacco use includes Snuff. Mr. Reading reports that he does not drink alcohol.  Review of Systems Negative except as  outlined.  Physical Examination Filed Vitals:   08/31/12 1505  BP: 110/80  Pulse: 64   Filed Weights   08/31/12 1505  Weight: 199 lb 0.6 oz (90.284 kg)    Comfortable at rest. HEENT: Conjunctivae and lids normal, oropharynx with moist mucosa.  Neck: Supple, no elevated JVP or bruits, no thyromegaly.  Lungs: Clear to auscultation, nonlabored.  Cardiac: Regular rate and rhythm, no rub or S3 gallop. Soft systolic murmur at the base, no loud diastolic murmur.  Abdomen: Soft, nontender, bowel sounds present.  Skin: Warm and dry.  Musculoskeletal: No kyphosis.  Extremities: No pitting edema, distal pulses full.  Neuropsychiatric: Alert and oriented x3, affect appropriate.   Problem List and Plan   Atrial fibrillation Continue current medical regimen. His CHADS2 score is one based on hypertension at this point, he is on anticoagulation under the direction of Dr. Margo Aye for now, but we do not plan to pursue cardioversion if medical therapy remains effective. Followup arranged.  HYPERTENSION, BENIGN Blood pressure well controlled today.    Jonelle Sidle, M.D., F.A.C.C.

## 2012-08-31 NOTE — Assessment & Plan Note (Signed)
Blood pressure well-controlled today. 

## 2012-09-22 ENCOUNTER — Encounter (INDEPENDENT_AMBULATORY_CARE_PROVIDER_SITE_OTHER): Payer: Self-pay | Admitting: *Deleted

## 2012-10-15 ENCOUNTER — Telehealth: Payer: Self-pay | Admitting: Cardiology

## 2012-10-15 MED ORDER — DILTIAZEM HCL ER COATED BEADS 120 MG PO CP24: 120.0000 mg | ORAL_CAPSULE | Freq: Every day | ORAL | Status: DC

## 2012-10-15 NOTE — Telephone Encounter (Signed)
Patient states that he feels like his heart is in a "different rhythm". Patient is on Flecanide. / tgs

## 2012-10-15 NOTE — Telephone Encounter (Signed)
Maybe should try Cardizem CD at lower dose such as 120 mg daily.

## 2012-10-15 NOTE — Telephone Encounter (Signed)
Called pt to confirm current signs and symptoms, pt noted this is the second week he has felt in and out of rhythm. Pt states it feels different then A-Fib. Pt takes Flecainide BID but stopped Cardizem a little over a week ago because he states his pulse was 58 and he felt tired. BP today is 143/75 and HR of 62. No c/o swelling, SOB, Chest pain, or dizziness.

## 2012-10-15 NOTE — Telephone Encounter (Signed)
Called pt advised him to try Cardizem CD 120 mg daily. Medication sent via escribe. Advised pt to call back if symptoms continue. Pt understood.

## 2012-12-08 ENCOUNTER — Other Ambulatory Visit: Payer: Self-pay | Admitting: Adult Health

## 2013-05-17 ENCOUNTER — Ambulatory Visit: Payer: Medicare HMO | Admitting: Adult Health

## 2013-05-20 ENCOUNTER — Ambulatory Visit: Payer: Medicare HMO | Admitting: Adult Health

## 2013-05-20 ENCOUNTER — Encounter: Payer: Medicare HMO | Admitting: Cardiology

## 2013-05-20 ENCOUNTER — Encounter: Payer: Self-pay | Admitting: Cardiology

## 2013-05-20 NOTE — Progress Notes (Signed)
No show  This encounter was created in error - please disregard.

## 2013-07-07 DIAGNOSIS — R002 Palpitations: Secondary | ICD-10-CM

## 2013-07-08 ENCOUNTER — Ambulatory Visit (INDEPENDENT_AMBULATORY_CARE_PROVIDER_SITE_OTHER): Payer: Medicare HMO | Admitting: Cardiology

## 2013-07-08 ENCOUNTER — Encounter: Payer: Self-pay | Admitting: Cardiology

## 2013-07-08 VITALS — BP 153/92 | HR 68 | Ht 71.0 in | Wt 191.0 lb

## 2013-07-08 DIAGNOSIS — I480 Paroxysmal atrial fibrillation: Secondary | ICD-10-CM

## 2013-07-08 DIAGNOSIS — I10 Essential (primary) hypertension: Secondary | ICD-10-CM

## 2013-07-08 DIAGNOSIS — Z79899 Other long term (current) drug therapy: Secondary | ICD-10-CM

## 2013-07-08 DIAGNOSIS — I359 Nonrheumatic aortic valve disorder, unspecified: Secondary | ICD-10-CM

## 2013-07-08 DIAGNOSIS — I4891 Unspecified atrial fibrillation: Secondary | ICD-10-CM

## 2013-07-08 MED ORDER — DILTIAZEM HCL 30 MG PO TABS: 30.0000 mg | ORAL_TABLET | Freq: Four times a day (QID) | ORAL | Status: DC | PRN

## 2013-07-08 NOTE — Patient Instructions (Signed)
Your physician recommends that you schedule a follow-up appointment in: 3 months. You will receive a reminder letter in the mail in about 1-2 months reminding you to call and schedule your appointment. If you don't receive this letter, please contact our office. Your physician has recommended you make the following change in your medication: STOP DILTIAZEM 120 MG. START DILTIAZEM 30 MG BY MOUTH EVERY 6 HOURS AS NEEDED FOR PALPITATIONS. YOUR NEW PRESCRIPTION WAS SENT TO YOUR PHARMACY. ALL OTHER MEDICATIONS WILL REMAIN THE SAME. Your physician has recommended that you wear a holter monitor. Holter monitors are medical devices that record the heart's electrical activity. Doctors most often use these monitors to diagnose arrhythmias. Arrhythmias are problems with the speed or rhythm of the heartbeat. The monitor is a small, portable device. You can wear one while you do your normal daily activities. This is usually used to diagnose what is causing palpitations/syncope (passing out). Your physician recommends that you have lab work to check your flecainide level. Please have this done on a morning just before your next dose is due.

## 2013-07-08 NOTE — Progress Notes (Signed)
    Clinical Summary [name] is a 53 y.o.male last seen in June 2014. At that time he was in sinus rhythm on flecainide. He is reporting recurring palpitations, no definite precipitant, states that he has been compliant with his regular medications. He has not been able to tolerate Cardizem CD daily however due to bradycardia and fatigue. He reports symptoms nearly every day.  ECG today shows normal sinus rhythm, QRS duration 104 ms.   No Known Allergies  Current Outpatient Prescriptions  Medication Sig Dispense Refill  . aspirin 81 MG tablet Take 81 mg by mouth daily.      . flecainide (TAMBOCOR) 50 MG tablet TAKE (1) TABLET TWICE DAILY.  60 tablet  6  . HYDROcodone-acetaminophen (NORCO) 7.5-325 MG per tablet Take 1 tablet by mouth as needed.      . nebivolol (BYSTOLIC) 10 MG tablet Take 10 mg by mouth daily.        . pantoprazole (PROTONIX) 40 MG tablet Take 40 mg by mouth daily.      Marland Kitchen. diltiazem (CARDIZEM) 30 MG tablet Take 1 tablet (30 mg total) by mouth every 6 (six) hours as needed. PALPITATIONS  30 tablet  3   No current facility-administered medications for this visit.    Past Medical History  Diagnosis Date  . Hypertension   . Hyperlipidemia   . GERD (gastroesophageal reflux disease)   . Esophageal stricture     Multiple dilations  . Atrial fibrillation     CHADSVASC score 1    Social History [name] reports that he quit smoking about 4 years ago. His smoking use included Cigarettes. He has a 7.5 pack-year smoking history. His smokeless tobacco use includes Snuff. [name] reports that he does not drink alcohol.  Review of Systems Negative except as outlined.  Physical Examination Filed Vitals:   07/08/13 1007  BP: 153/92  Pulse: 68   Filed Weights   07/08/13 1007  Weight: 191 lb (86.637 kg)    Comfortable at rest.  HEENT: Conjunctivae and lids normal, oropharynx with moist mucosa.  Neck: Supple, no elevated JVP or bruits, no thyromegaly.  Lungs:  Clear to auscultation, nonlabored.  Cardiac: Regular rate and rhythm, no rub or S3 gallop. Soft systolic murmur at the base, no loud diastolic murmur.  Abdomen: Soft, nontender, bowel sounds present.  Skin: Warm and dry.  Musculoskeletal: No kyphosis.  Extremities: No pitting edema, distal pulses full.  Neuropsychiatric: Alert and oriented x3, affect appropriate.   Problem List and Plan   Paroxysmal atrial fibrillation Plan to provide 48 hour Holter monitor to determine if he is having definite breakthrough PAF with his symptoms of palpitations. Also check flecainide level as we may need to increase dose to 100 mg twice daily. Cardizem CD will be discontinued and we will replace it with short acting Cardizem 30 mg as needed. He is otherwise on Bystolic for blood pressure and heart rate control. Plan to continue aspirin for now with low thromboembolic risk score. Followup arranged.  HYPERTENSION, BENIGN No other changes to current regimen except as outlined.  Aortic valve disorders Mild aortic regurgitation. No change in examination.    Wesley [name], M.D., F.A.C.C.

## 2013-07-08 NOTE — Assessment & Plan Note (Signed)
Mild aortic regurgitation. No change in examination.

## 2013-07-08 NOTE — Assessment & Plan Note (Signed)
No other changes to current regimen except as outlined.

## 2013-07-08 NOTE — Assessment & Plan Note (Signed)
Plan to provide 48 hour Holter monitor to determine if he is having definite breakthrough PAF with his symptoms of palpitations. Also check flecainide level as we may need to increase dose to 100 mg twice daily. Cardizem CD will be discontinued and we will replace it with short acting Cardizem 30 mg as needed. He is otherwise on Bystolic for blood pressure and heart rate control. Plan to continue aspirin for now with low thromboembolic risk score. Followup arranged.

## 2013-07-20 ENCOUNTER — Telehealth: Payer: Self-pay | Admitting: *Deleted

## 2013-07-20 ENCOUNTER — Encounter: Payer: Self-pay | Admitting: *Deleted

## 2013-07-20 MED ORDER — FLECAINIDE ACETATE 100 MG PO TABS: 100.0000 mg | ORAL_TABLET | Freq: Two times a day (BID) | ORAL | Status: DC

## 2013-07-20 NOTE — Telephone Encounter (Signed)
Message copied by Eustace MooreANDERSON, Masahiro Iglesia M on Tue Jul 20, 2013  3:40 PM ------      Message from: MCDOWELL, Illene BolusSAMUEL G      Created: Mon Jul 19, 2013 11:06 AM       Reviewed. Flecainide level is low. This would suggest that he may do better in terms of suppression of PAF by increasing flecainide to 100 mg twice daily. I have not seen his Holter monitor as yet.  Please go ahead and increase flecainide to 100 mg twice daily. He should already have an office followup scheduled. He is going to need a basic GXT approximately 2 weeks after having the increase in flecainide to exclude proarrhythmia. ------

## 2013-07-20 NOTE — Telephone Encounter (Signed)
This encounter was created in error - please disregard.

## 2013-07-20 NOTE — Telephone Encounter (Signed)
Patient informed. 

## 2013-07-23 ENCOUNTER — Telehealth: Payer: Self-pay | Admitting: *Deleted

## 2013-07-23 NOTE — Telephone Encounter (Signed)
Patient informed. See monitor results for details.

## 2013-08-12 ENCOUNTER — Encounter (INDEPENDENT_AMBULATORY_CARE_PROVIDER_SITE_OTHER): Payer: Self-pay | Admitting: *Deleted

## 2013-08-12 ENCOUNTER — Other Ambulatory Visit: Payer: Self-pay | Admitting: Adult Health

## 2013-10-11 ENCOUNTER — Encounter: Payer: Self-pay | Admitting: Cardiology

## 2013-10-11 ENCOUNTER — Ambulatory Visit (INDEPENDENT_AMBULATORY_CARE_PROVIDER_SITE_OTHER): Payer: Medicare HMO | Admitting: Cardiology

## 2013-10-11 ENCOUNTER — Encounter: Payer: Self-pay | Admitting: *Deleted

## 2013-10-11 VITALS — BP 140/78 | HR 65 | Ht 71.0 in | Wt 192.0 lb

## 2013-10-11 DIAGNOSIS — I10 Essential (primary) hypertension: Secondary | ICD-10-CM

## 2013-10-11 DIAGNOSIS — I480 Paroxysmal atrial fibrillation: Secondary | ICD-10-CM

## 2013-10-11 DIAGNOSIS — I4891 Unspecified atrial fibrillation: Secondary | ICD-10-CM

## 2013-10-11 DIAGNOSIS — I359 Nonrheumatic aortic valve disorder, unspecified: Secondary | ICD-10-CM

## 2013-10-11 NOTE — Assessment & Plan Note (Signed)
Continue current regimen which includes flecainide and short acting diltiazem as well as aspirin. Followup arranged in 6 months.

## 2013-10-11 NOTE — Patient Instructions (Signed)
Your physician recommends that you schedule a follow-up appointment in: 6 months. You will receive a reminder letter in the mail in about months reminding you to call and schedule your appointment. If you don't receive this letter, please contact our office. Your physician recommends that you continue on your current medications as directed. Please refer to the Current Medication list given to you today. 

## 2013-10-11 NOTE — Assessment & Plan Note (Signed)
Mild aortic regurgitation. No change in examination.

## 2013-10-11 NOTE — Progress Notes (Signed)
    Clinical Summary [name] is a 53 y.o.male last seen in April. Since that visit, we increased flecainide to 100 mg daily following documentation of a low level (0.15). He had also been experiencing intermittent palpitations as well.  48 hour Holter monitor showed sinus rhythm with occasional PVCs and PACs, also brief bursts of PSVT. No sustained atrial fibrillation was noted. Followup GXT after increase in flecainide did not demonstrate any inducible arrhythmias, maximum heart rate only 71% on medical therapy.  He tells me that his palpitations have improved, not completely resolved. He has had no prolonged episodes of atrial fibrillation.  No Known Allergies  Current Outpatient Prescriptions  Medication Sig Dispense Refill  . aspirin 81 MG tablet Take 81 mg by mouth daily.      Marland Kitchen. diltiazem (CARDIZEM) 30 MG tablet Take 1 tablet (30 mg total) by mouth every 6 (six) hours as needed. PALPITATIONS  30 tablet  3  . flecainide (TAMBOCOR) 100 MG tablet Take 1 tablet (100 mg total) by mouth 2 (two) times daily.  180 tablet  3  . HYDROcodone-acetaminophen (NORCO) 7.5-325 MG per tablet Take 1 tablet by mouth every 6 (six) hours as needed.       . naproxen (NAPROSYN) 500 MG tablet Take 500 mg by mouth daily as needed.      . nebivolol (BYSTOLIC) 10 MG tablet Take 10 mg by mouth daily.        . pantoprazole (PROTONIX) 40 MG tablet Take 40 mg by mouth daily.       No current facility-administered medications for this visit.    Past Medical History  Diagnosis Date  . Hypertension   . Hyperlipidemia   . GERD (gastroesophageal reflux disease)   . Esophageal stricture     Multiple dilations  . Atrial fibrillation     CHADSVASC score 1    Social History [name] reports that he quit smoking about 4 years ago. His smoking use included Cigarettes. He has a 7.5 pack-year smoking history. His smokeless tobacco use includes Snuff. [name] reports that he does not drink alcohol.  Review of  Systems No chest pain or unusual shortness of breath. No speech deficits or focal motor weakness. Other systems reviewed and negative.  Physical Examination Filed Vitals:   10/11/13 0830  BP: 140/78  Pulse: 65   Filed Weights   10/11/13 0812  Weight: 192 lb (87.091 kg)    Comfortable at rest.  HEENT: Conjunctivae and lids normal, oropharynx with moist mucosa.  Neck: Supple, no elevated JVP or bruits, no thyromegaly.  Lungs: Clear to auscultation, nonlabored.  Cardiac: Regular rate and rhythm, no rub or S3 gallop. Soft systolic murmur at the base, no loud diastolic murmur.  Abdomen: Soft, nontender, bowel sounds present.  Skin: Warm and dry.  Musculoskeletal: No kyphosis.  Extremities: No pitting edema, distal pulses full.  Neuropsychiatric: Alert and oriented x3, affect appropriate.   Problem List and Plan   Paroxysmal atrial fibrillation Continue current regimen which includes flecainide and short acting diltiazem as well as aspirin. Followup arranged in 6 months.  HYPERTENSION, BENIGN Blood pressure elevated today. Continue current regimen and keep followup with Dr. Margo AyeHall.  Aortic valve disorders Mild aortic regurgitation. No change in examination.    Jonelle SidleSamuel G. McDowell, M.D., F.A.C.C.

## 2013-10-11 NOTE — Assessment & Plan Note (Signed)
Blood pressure elevated today. Continue current regimen and keep followup with Dr. Margo AyeHall.

## 2014-07-25 ENCOUNTER — Other Ambulatory Visit (HOSPITAL_COMMUNITY): Payer: Self-pay | Admitting: Internal Medicine

## 2014-07-25 ENCOUNTER — Ambulatory Visit (HOSPITAL_COMMUNITY)
Admission: RE | Admit: 2014-07-25 | Discharge: 2014-07-25 | Disposition: A | Discharge status: Home / Self Care | Payer: Managed Care, Other (non HMO) | Source: Ambulatory Visit | Attending: Internal Medicine | Admitting: Internal Medicine

## 2014-07-25 DIAGNOSIS — R110 Nausea: Secondary | ICD-10-CM | POA: Insufficient documentation

## 2014-07-25 DIAGNOSIS — R1011 Right upper quadrant pain: Secondary | ICD-10-CM

## 2014-09-07 ENCOUNTER — Telehealth: Payer: Self-pay | Admitting: Cardiology

## 2014-09-07 NOTE — Telephone Encounter (Signed)
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

## 2014-09-07 NOTE — Telephone Encounter (Signed)
Patient needs a letter stating that he is cleared for DOT

## 2014-10-10 ENCOUNTER — Encounter: Payer: Self-pay | Admitting: Cardiology

## 2014-10-10 ENCOUNTER — Ambulatory Visit (INDEPENDENT_AMBULATORY_CARE_PROVIDER_SITE_OTHER): Payer: Medicare HMO | Admitting: Cardiology

## 2014-10-10 ENCOUNTER — Encounter: Payer: Self-pay | Admitting: *Deleted

## 2014-10-10 VITALS — BP 148/81 | HR 75 | Ht 71.0 in | Wt 188.1 lb

## 2014-10-10 DIAGNOSIS — I10 Essential (primary) hypertension: Secondary | ICD-10-CM

## 2014-10-10 DIAGNOSIS — I480 Paroxysmal atrial fibrillation: Secondary | ICD-10-CM

## 2014-10-10 MED ORDER — FLECAINIDE ACETATE 100 MG PO TABS: 100.0000 mg | ORAL_TABLET | Freq: Two times a day (BID) | ORAL | Status: DC

## 2014-10-10 NOTE — Patient Instructions (Signed)
   Continue Flecainide at 100mg  twice a day  - [facility]. Continue all other medications.   Lab for Flecainide level - do just prior to your morning dose. - order given today. Office will contact with results via phone or letter.   Your physician wants you to follow up in: 6 months.  You will receive a reminder letter in the mail one-two months in advance.  If you don't receive a letter, please call our office to schedule the follow up appointment

## 2014-10-10 NOTE — Progress Notes (Signed)
.    Cardiology Office Note  Date: 10/10/2014   ID: [name], DOB July 09, 1960, MRN [id number]  PCP: Catalina Pizza, MD  Primary Cardiologist: Nona Dell, MD   Chief Complaint  Patient presents with  . Atrial Fibrillation    History of Present Illness: Wesley [name] is a 54 y.o. male last seen in July 2015. He presents for routine follow-up. Continues to describe fairly frequent palpitations, although we have not actually documented any recurrent atrial fibrillation. He did have follow-up monitoring and remains on flecainide at 100 mg twice daily. He uses Cardizem only occasionally in addition to [employer].  ECG today is normal showing atrial fibrillation with normal intervals.  He remains with low thrombolic risk score, CHADSVASC of 1.  Today we discussed a follow-up flecainide level to ensure that his dose is adequate in light of his recurring palpitations. We also talked about the possibility of taking standing Cardizem in addition to his Bystolic, although he states that he tried this for a period time and he felt worse with slower heart rates.   Past Medical History  Diagnosis Date  . Hypertension   . Hyperlipidemia   . GERD (gastroesophageal reflux disease)   . Esophageal stricture     Multiple dilations  . Atrial fibrillation     CHADSVASC score 1    Current Outpatient Prescriptions  Medication Sig Dispense Refill  . aspirin 81 MG tablet Take 81 mg by mouth daily.    . diclofenac (VOLTAREN) 50 MG EC tablet Take 50 mg by mouth daily.    Marland Kitchen diltiazem (CARDIZEM) 30 MG tablet Take 1 tablet (30 mg total) by mouth every 6 (six) hours as needed. PALPITATIONS (Patient taking differently: Take 30 mg by mouth as needed. PALPITATIONS) 30 tablet 3  . flecainide (TAMBOCOR) 100 MG tablet Take 1 tablet (100 mg total) by mouth 2 (two) times daily. 180 tablet 3  . HYDROcodone-acetaminophen (NORCO) 7.5-325 MG per tablet Take 1 tablet by mouth every 6 (six) hours as needed.     .  nebivolol (BYSTOLIC) 10 MG tablet Take 10 mg by mouth daily.      . pantoprazole (PROTONIX) 40 MG tablet Take 40 mg by mouth daily.     No current facility-administered medications for this visit.    Allergies:  Review of patient's allergies indicates no known allergies.   Social History: The patient  reports that he quit smoking about 5 years ago. His smoking use included Cigarettes. He has a 7.5 pack-year smoking history. His smokeless tobacco use includes Snuff. He reports that he does not drink alcohol or use illicit drugs.   ROS:  Please see the history of present illness. Otherwise, complete review of systems is positive for arthritic pains, ringing in right ear.  All other systems are reviewed and negative.   Physical Exam: VS:  BP 148/81 mmHg  Pulse 75  Ht  (1.803 m)  Wt 188 lb 1.9 oz (85.331 kg)  BMI 26.25 kg/m2  SpO2 97%, BMI Body mass index is 26.25 kg/(m^2).  Wt Readings from Last 3 Encounters:  10/10/14 188 lb 1.9 oz (85.331 kg)  10/11/13 192 lb (87.091 kg)  07/08/13 191 lb (86.637 kg)    Comfortable at rest.  HEENT: Conjunctivae and lids normal, oropharynx with moist mucosa.  Neck: Supple, no elevated JVP or bruits, no thyromegaly.  Lungs: Clear to auscultation, nonlabored.  Cardiac: Regular rate and rhythm, no rub or S3 gallop. Soft systolic murmur at the base, no  loud diastolic murmur.  Abdomen: Soft, nontender, bowel sounds present.  Skin: Warm and dry.  Musculoskeletal: No kyphosis.  Extremities: No pitting edema, distal pulses full.  Neuropsychiatric: Alert and oriented x3, affect appropriate.   ECG: ECG is ordered today.  Assessment and Plan:  1. Paroxysmal atrial fibrillation with low thromboembolic risk score. Plan to continue current medical regimen, recheck flecainide level to ensure in adequate range.  2. Essential hypertension, continue current regimen and keep follow-up with Dr. Margo AyeHall.  Current medicines were reviewed with the  patient today.   Orders Placed This Encounter  Procedures  . Flecainide level  . EKG 12-Lead    Disposition: FU with me in 6 months.   Signed, Jonelle SidleSamuel G. Dayona Shaheen, MD, [facility] 10/10/2014 5:02 PM    [facility] [address], [place], [place] [address] Phone: [phone]; Fax: [phone]

## 2014-12-22 ENCOUNTER — Telehealth: Payer: Self-pay | Admitting: *Deleted

## 2015-01-03 ENCOUNTER — Other Ambulatory Visit: Payer: Self-pay | Admitting: Cardiology

## 2015-01-07 LAB — FLECAINIDE LEVEL: FLECAINIDE: 0.27 ug/mL (ref 0.20–1.00)

## 2015-01-11 NOTE — Telephone Encounter (Signed)
Lab work completed.

## 2015-01-12 ENCOUNTER — Telehealth: Payer: Self-pay | Admitting: *Deleted

## 2015-01-12 NOTE — Telephone Encounter (Signed)
Patient informed and said that his palpitations were well controlled at this time. Patient also confirmed that he is currently on flecainide 100 mg twice daily. Nurse advised patient to contact our office if he were to start having problems with the palpitations. Patient verbalized understanding of plan.

## 2015-01-12 NOTE — Telephone Encounter (Signed)
-----   Message from Jonelle SidleSamuel G McDowell, MD sent at 01/09/2015  4:49 PM EDT ----- Flecainide level at low end of normal range. If still having frequent palpitations on current regimen, can consider increasing Flecainide to 150 mg BID.

## 2015-05-17 ENCOUNTER — Encounter: Payer: Self-pay | Admitting: *Deleted

## 2015-05-22 ENCOUNTER — Telehealth: Payer: Self-pay | Admitting: Orthopaedic Surgery

## 2015-05-22 NOTE — Telephone Encounter (Signed)
Patient requests a refill for Norco 7.5-325 mgs. Qty 120 This was last filled on 04-24-15

## 2015-05-23 MED ORDER — HYDROCODONE-ACETAMINOPHEN 7.5-325 MG PO TABS: 1.0000 | ORAL_TABLET | ORAL | Status: DC | PRN

## 2015-05-23 NOTE — Telephone Encounter (Signed)
Rx printed

## 2015-06-20 ENCOUNTER — Telehealth: Payer: Self-pay | Admitting: Orthopaedic Surgery

## 2015-06-20 MED ORDER — HYDROCODONE-ACETAMINOPHEN 7.5-325 MG PO TABS: 1.0000 | ORAL_TABLET | ORAL | Status: DC | PRN

## 2015-06-20 NOTE — Telephone Encounter (Signed)
rx done

## 2015-07-11 DIAGNOSIS — J060 Acute laryngopharyngitis: Secondary | ICD-10-CM | POA: Diagnosis not present

## 2015-07-11 DIAGNOSIS — R509 Fever, unspecified: Secondary | ICD-10-CM | POA: Diagnosis not present

## 2015-07-11 DIAGNOSIS — R05 Cough: Secondary | ICD-10-CM | POA: Diagnosis not present

## 2015-07-17 ENCOUNTER — Telehealth: Payer: Self-pay | Admitting: Orthopaedic Surgery

## 2015-07-18 MED ORDER — HYDROCODONE-ACETAMINOPHEN 7.5-325 MG PO TABS: 1.0000 | ORAL_TABLET | ORAL | Status: DC | PRN

## 2015-07-18 NOTE — Telephone Encounter (Signed)
Rx done. 

## 2015-07-25 ENCOUNTER — Ambulatory Visit: Payer: Medicare HMO | Admitting: Orthopaedic Surgery

## 2015-07-26 ENCOUNTER — Ambulatory Visit: Payer: Medicare HMO | Admitting: Orthopaedic Surgery

## 2015-07-31 DIAGNOSIS — R7301 Impaired fasting glucose: Secondary | ICD-10-CM | POA: Diagnosis not present

## 2015-07-31 DIAGNOSIS — E782 Mixed hyperlipidemia: Secondary | ICD-10-CM | POA: Diagnosis not present

## 2015-07-31 DIAGNOSIS — I10 Essential (primary) hypertension: Secondary | ICD-10-CM | POA: Diagnosis not present

## 2015-07-31 DIAGNOSIS — E291 Testicular hypofunction: Secondary | ICD-10-CM | POA: Diagnosis not present

## 2015-08-02 ENCOUNTER — Encounter: Payer: Self-pay | Admitting: Orthopaedic Surgery

## 2015-08-02 ENCOUNTER — Ambulatory Visit (INDEPENDENT_AMBULATORY_CARE_PROVIDER_SITE_OTHER): Payer: BLUE CROSS/BLUE SHIELD | Admitting: Orthopaedic Surgery

## 2015-08-02 VITALS — BP 146/82 | HR 62 | Temp 97.5°F | Resp 16 | Ht 71.0 in | Wt 188.0 lb

## 2015-08-02 DIAGNOSIS — I480 Paroxysmal atrial fibrillation: Secondary | ICD-10-CM | POA: Diagnosis not present

## 2015-08-02 DIAGNOSIS — M25511 Pain in right shoulder: Secondary | ICD-10-CM | POA: Diagnosis not present

## 2015-08-02 DIAGNOSIS — I10 Essential (primary) hypertension: Secondary | ICD-10-CM | POA: Diagnosis not present

## 2015-08-02 NOTE — Progress Notes (Signed)
Patient ID:Wesley [name], male DOB:[birth year], 55 y.o. WUJ:[id number]  Chief Complaint  Patient presents with  . Follow-up    FOLLOW UP RIGHT SHOUDLER PAIN    HPI  Wesley [name] is a 55 y.o. male who has chronic pain of the right shoulder.  He has no new trauma.He is doing his exercises and taking his medicine.  He has no redness or numbness.  Shoulder Pain  The pain is present in the right shoulder. This is a chronic problem. The current episode started more than 1 year ago. There has been no history of extremity trauma. The problem occurs daily. The problem has been waxing and waning. The quality of the pain is described as aching. The pain is at a severity of 3/10. The pain is mild. Associated symptoms include a limited range of motion. Pertinent negatives include no joint locking, joint swelling, numbness, stiffness or tingling. The symptoms are aggravated by activity. He has tried oral narcotics, NSAIDS, heat, cold and acetaminophen for the symptoms. The treatment provided moderate relief.   He was hit by a baseball last week on the right medial ankle.  He has some bruising and pain.  He is walking better.  He has atrial fibrillation and is on medicine for this.    He has hypertension well controlled.  Body mass index is 26.23 kg/(m^2).  Review of Systems  HENT: Negative for congestion.   Respiratory: Negative for cough and shortness of breath.   Cardiovascular: Negative for chest pain and leg swelling.  Endocrine: Negative for cold intolerance.  Musculoskeletal: Positive for myalgias and arthralgias. Negative for stiffness.  Allergic/Immunologic: Negative for environmental allergies.  Neurological: Negative for tingling and numbness.    Past Medical History  Diagnosis Date  . Hypertension   . Hyperlipidemia   . GERD (gastroesophageal reflux disease)   . Esophageal stricture     Multiple dilations  . Atrial fibrillation (HCC)     CHADSVASC score 1    Past Surgical History   Procedure Laterality Date  . Lumbar luminectomy  2007    Dr. Coletta Memos  . Partial finger amputation      Family History  Problem Relation Age of Onset  . Heart failure Father     Died age 22  . Heart attack Brother     Age 65  . Aortic stenosis Brother     Bicuspid valve status post AVR    Social History Social History  Substance Use Topics  . Smoking status: Former Smoker -- 0.50 packs/day for 15 years    Types: Cigarettes    Quit date: 12/30/2008  . Smokeless tobacco: Current User    Types: Snuff     Comment: dips 2 cans/snuff per week  . Alcohol Use: No    No Known Allergies  Current Outpatient Prescriptions  Medication Sig Dispense Refill  . aspirin 81 MG tablet Take 81 mg by mouth daily.    Marland Kitchen diltiazem (CARDIZEM) 30 MG tablet Take 1 tablet (30 mg total) by mouth every 6 (six) hours as needed. PALPITATIONS (Patient taking differently: Take 30 mg by mouth as needed. PALPITATIONS) 30 tablet 3  . flecainide (TAMBOCOR) 100 MG tablet Take 1 tablet (100 mg total) by mouth 2 (two) times daily. 180 tablet 3  . HYDROcodone-acetaminophen (NORCO) 7.5-325 MG tablet Take 1 tablet by mouth every 4 (four) hours as needed for moderate pain (Must last 30 days.  Do not drive or operate machinery while taking this medicine.). 120 tablet 0  .  nebivolol (BYSTOLIC) 10 MG tablet Take 10 mg by mouth daily.      . pantoprazole (PROTONIX) 40 MG tablet Take 40 mg by mouth daily.     No current facility-administered medications for this visit.     Physical Exam  Blood pressure 146/82, pulse 62, temperature 97.5 F (36.4 C), resp. rate 16, height 5\' 11"  (1.803 m), weight 188 lb (85.276 kg).  Constitutional: overall normal hygiene, normal nutrition, well developed, normal grooming, normal body habitus. Assistive device:none  Musculoskeletal: gait and station Limp none, muscle tone and strength are normal, no tremors or atrophy is present.  .  Neurological: coordination overall normal.   Deep tendon reflex/nerve stretch intact.  Sensation normal.  Cranial nerves II-XII intact.   Skin:   Scar right shoulder, otherwise overall no scars, lesions, ulcers or rashes. No psoriasis.  Psychiatric: Alert and oriented x 3.  Recent memory intact, remote memory unclear.  Normal mood and affect. Well groomed.  Good eye contact.  Cardiovascular: overall no swelling, no varicosities, no edema bilaterally, normal temperatures of the legs and arms, no clubbing, cyanosis and good capillary refill.  Lymphatic: palpation is normal.  Examination of right Upper Extremity is done.  Inspection:   Overall:  Elbow non-tender without crepitus or defects, forearm non-tender without crepitus or defects, wrist non-tender without crepitus or defects, hand non-tender.    Shoulder: with glenohumeral joint tenderness, without effusion.   Upper arm: without swelling and tenderness   Range of motion:   Overall:  Full range of motion of the elbow, full range of motion of wrist and full range of motion in fingers.   Shoulder:  right  180 degrees forward flexion; 180 degrees abduction; 35 degrees internal rotation, 35 degrees external rotation, 20 degrees extension, 40 degrees adduction.   Stability:   Overall:  Shoulder, elbow and wrist stable   Strength and Tone:   Overall full shoulder muscles strength, full upper arm strength and normal upper arm bulk and tone.  He has tenderness of the right shoulder with full overhead motion and with resisted abduction.  The left shoulder is negative.  The patient has been educated about the nature of the problem(s) and counseled on treatment options.  The patient appeared to understand what I have discussed and is in agreement with it.  Encounter Diagnoses  Name Primary?  . Right shoulder pain Yes  . Paroxysmal atrial fibrillation (HCC)   . HYPERTENSION, BENIGN     PLAN Call if any problems.  Precautions discussed.  Continue current medications.   Return to  clinic 3 months

## 2015-08-14 ENCOUNTER — Other Ambulatory Visit (HOSPITAL_COMMUNITY): Payer: Self-pay | Admitting: Internal Medicine

## 2015-08-14 ENCOUNTER — Ambulatory Visit (HOSPITAL_COMMUNITY)
Admission: RE | Admit: 2015-08-14 | Discharge: 2015-08-14 | Disposition: A | Discharge status: Home / Self Care | Payer: BLUE CROSS/BLUE SHIELD | Source: Ambulatory Visit | Attending: Internal Medicine | Admitting: Internal Medicine

## 2015-08-14 DIAGNOSIS — N509 Disorder of male genital organs, unspecified: Secondary | ICD-10-CM | POA: Insufficient documentation

## 2015-08-14 DIAGNOSIS — N5089 Other specified disorders of the male genital organs: Secondary | ICD-10-CM

## 2015-08-14 DIAGNOSIS — I861 Scrotal varices: Secondary | ICD-10-CM | POA: Diagnosis not present

## 2015-08-14 DIAGNOSIS — N50819 Testicular pain, unspecified: Secondary | ICD-10-CM | POA: Diagnosis not present

## 2015-08-16 ENCOUNTER — Ambulatory Visit (HOSPITAL_COMMUNITY): Payer: BLUE CROSS/BLUE SHIELD

## 2015-08-16 ENCOUNTER — Telehealth: Payer: Self-pay | Admitting: Orthopaedic Surgery

## 2015-08-16 MED ORDER — HYDROCODONE-ACETAMINOPHEN 7.5-325 MG PO TABS: 1.0000 | ORAL_TABLET | ORAL | Status: DC | PRN

## 2015-08-16 NOTE — Addendum Note (Signed)
Addended by: Earnstine RegalKEELING, JOHN W on: 08/16/2015 02:40 PM   Modules accepted: Orders

## 2015-08-16 NOTE — Telephone Encounter (Signed)
Rx done. 

## 2015-08-21 DIAGNOSIS — E782 Mixed hyperlipidemia: Secondary | ICD-10-CM | POA: Diagnosis not present

## 2015-08-21 DIAGNOSIS — N529 Male erectile dysfunction, unspecified: Secondary | ICD-10-CM | POA: Diagnosis not present

## 2015-08-21 DIAGNOSIS — K219 Gastro-esophageal reflux disease without esophagitis: Secondary | ICD-10-CM | POA: Diagnosis not present

## 2015-08-21 DIAGNOSIS — I480 Paroxysmal atrial fibrillation: Secondary | ICD-10-CM | POA: Diagnosis not present

## 2015-09-13 ENCOUNTER — Telehealth: Payer: Self-pay | Admitting: Orthopaedic Surgery

## 2015-09-13 MED ORDER — HYDROCODONE-ACETAMINOPHEN 7.5-325 MG PO TABS: 1.0000 | ORAL_TABLET | ORAL | Status: DC | PRN

## 2015-09-13 NOTE — Addendum Note (Signed)
Addended by: Earnstine RegalKEELING, JOHN W on: 09/13/2015 03:44 PM   Modules accepted: Orders

## 2015-09-13 NOTE — Telephone Encounter (Signed)
Rx done. 

## 2015-09-21 ENCOUNTER — Encounter: Payer: Self-pay | Admitting: *Deleted

## 2015-09-21 ENCOUNTER — Encounter: Payer: Self-pay | Admitting: Cardiology

## 2015-09-21 ENCOUNTER — Ambulatory Visit (INDEPENDENT_AMBULATORY_CARE_PROVIDER_SITE_OTHER): Payer: BLUE CROSS/BLUE SHIELD | Admitting: Cardiology

## 2015-09-21 VITALS — BP 135/79 | HR 62 | Ht 71.0 in | Wt 189.8 lb

## 2015-09-21 DIAGNOSIS — I480 Paroxysmal atrial fibrillation: Secondary | ICD-10-CM | POA: Diagnosis not present

## 2015-09-21 DIAGNOSIS — I10 Essential (primary) hypertension: Secondary | ICD-10-CM | POA: Diagnosis not present

## 2015-09-21 NOTE — Progress Notes (Signed)
Cardiology Office Note  Date: 09/21/2015   ID: Wesley [name], DOB 01/22/1961, MRN [id number]  PCP: Dwana MelenaZack Hall, MD  Primary Cardiologist: Nona DellSamuel Nivia Gervase, MD   Chief Complaint  Patient presents with  . PAF    History of Present Illness: Wesley [name] is a 55 y.o. male last seen in July 2016. He presents for a routine follow-up visit. He still reports occasional, very brief palpitations, oftentimes in the evening. He has had no prolonged events however or documented recurrent atrial fibrillation on present regimen.  I reviewed his ECG today which shows normal sinus rhythm, normal QTc, QRS duration 106 ms. Medical regimen includes flecainide, aspirin, Bystolic, and as needed Cardizem which he uses very infrequently.  He reports recent follow-up physical with lab work per Dr. Margo AyeHall, requesting records.  Past Medical History  Diagnosis Date  . Hypertension   . Hyperlipidemia   . GERD (gastroesophageal reflux disease)   . Esophageal stricture     Multiple dilations  . Atrial fibrillation (HCC)     CHADSVASC score 1    Current Outpatient Prescriptions  Medication Sig Dispense Refill  . aspirin 81 MG tablet Take 81 mg by mouth daily.    Marland Kitchen. diltiazem (CARDIZEM) 30 MG tablet Take 1 tablet (30 mg total) by mouth every 6 (six) hours as needed. PALPITATIONS (Patient taking differently: Take 30 mg by mouth as needed. PALPITATIONS) 30 tablet 3  . ezetimibe (ZETIA) 10 MG tablet Take 10 mg by mouth daily.  5  . flecainide (TAMBOCOR) 100 MG tablet Take 1 tablet (100 mg total) by mouth 2 (two) times daily. 180 tablet 3  . HYDROcodone-acetaminophen (NORCO) 7.5-325 MG tablet Take 1 tablet by mouth every 4 (four) hours as needed for moderate pain (Must last 30 days.  Do not drive or operate machinery while taking this medicine.). 120 tablet 0  . nebivolol (BYSTOLIC) 10 MG tablet Take 10 mg by mouth daily.      . pantoprazole (PROTONIX) 40 MG tablet Take 40 mg by mouth daily.    . tamsulosin (FLOMAX)  0.4 MG CAPS capsule Take 1 capsule by mouth at bedtime.     No current facility-administered medications for this visit.   Allergies:  Review of patient's allergies indicates no known allergies.   Social History: The patient  reports that he quit smoking about 6 years ago. His smoking use included Cigarettes. He has a 7.5 pack-year smoking history. His smokeless tobacco use includes Snuff. He reports that he does not drink alcohol or use illicit drugs.   ROS:  Please see the history of present illness. Otherwise, complete review of systems is positive for stress.  All other systems are reviewed and negative.   Physical Exam: VS:  BP 135/79 mmHg  Pulse 62  Ht 5\' 11"  (1.803 m)  Wt 189 lb 12.8 oz (86.093 kg)  BMI 26.48 kg/m2  SpO2 98%, BMI Body mass index is 26.48 kg/(m^2).  Wt Readings from Last 3 Encounters:  09/21/15 189 lb 12.8 oz (86.093 kg)  08/02/15 188 lb (85.276 kg)  10/10/14 188 lb 1.9 oz (85.331 kg)    Comfortable at rest.  HEENT: Conjunctivae and lids normal, oropharynx with moist mucosa.  Neck: Supple, no elevated JVP or bruits, no thyromegaly.  Lungs: Clear to auscultation, nonlabored.  Cardiac: Regular rate and rhythm, no rub or S3 gallop. Soft systolic murmur at the base, no loud diastolic murmur.  Abdomen: Soft, nontender, bowel sounds present.  Skin: Warm and dry.  Musculoskeletal: No kyphosis.  Extremities: No pitting edema, distal pulses full.   ECG: I personally reviewed the tracing from 10/10/2014 which showed normal sinus rhythm, normal QTc and QRS duration 100 ms.  Other Studies Reviewed Today:  Echocardiogram 05/10/2010 [facility]([place]): Moderate basal septal LV hypertrophy, LVEF 55-60%, mild aortic regurgitation.  Exercise Myoview 05/10/2010 [facility]([place]): No diagnostic ST segment changes at 12.8 metastases. No chest pain. No ischemic perfusion defects with LVEF 59%.  Assessment and Plan:  1. Paroxysmal atrial fibrillation, overall well controlled on  current regimen. ECG reviewed. Requesting most recent lab work from Dr. Margo AyeHall.  2. Essential hypertension, blood pressure control is adequate today.  Current medicines were reviewed with the patient today.   Orders Placed This Encounter  Procedures  . EKG 12-Lead    Disposition: Follow-up with me in 6 months.  Signed, Jonelle SidleSamuel G. Laylanie Kruczek, MD, [facility]ACC 09/21/2015 4:02 PM    [facility] [address], [place], [place] [address] Phone: [phone]; Fax: [phone]

## 2015-09-21 NOTE — Patient Instructions (Signed)

## 2015-10-11 ENCOUNTER — Telehealth: Payer: Self-pay | Admitting: Orthopaedic Surgery

## 2015-10-11 MED ORDER — HYDROCODONE-ACETAMINOPHEN 7.5-325 MG PO TABS: 1.0000 | ORAL_TABLET | ORAL | Status: DC | PRN

## 2015-10-11 NOTE — Telephone Encounter (Signed)
Rx Done . 

## 2015-11-02 ENCOUNTER — Encounter: Payer: Self-pay | Admitting: Orthopaedic Surgery

## 2015-11-02 ENCOUNTER — Ambulatory Visit (INDEPENDENT_AMBULATORY_CARE_PROVIDER_SITE_OTHER): Payer: BLUE CROSS/BLUE SHIELD | Admitting: Orthopaedic Surgery

## 2015-11-02 VITALS — BP 153/94 | HR 64 | Temp 97.3°F | Resp 16 | Ht 71.0 in | Wt 184.0 lb

## 2015-11-02 DIAGNOSIS — I10 Essential (primary) hypertension: Secondary | ICD-10-CM

## 2015-11-02 DIAGNOSIS — M25511 Pain in right shoulder: Secondary | ICD-10-CM | POA: Diagnosis not present

## 2015-11-02 MED ORDER — HYDROCODONE-ACETAMINOPHEN 7.5-325 MG PO TABS: 1.0000 | ORAL_TABLET | ORAL | 0 refills | Status: DC | PRN

## 2015-11-02 NOTE — Progress Notes (Signed)
Patient ID:Wesley [name], male DOB:[birth year], 55 y.o. WUJ:[id number]  Chief Complaint  Patient presents with  . Follow-up    3 month recheck on right shoulder.    HPI  Wesley [name] is a 55 y.o. male who has chronic right shoulder pain.  He is post rotator cuff repair in the past.  He has pain on overhead use at times and sometimes at night.  He has no new trauma.  He has no paresthesias.  He is active.  He is taking his medicine and ibuprofen. HPI  Body mass index is 25.66 kg/m.  ROS  Review of Systems  HENT: Negative for congestion.   Respiratory: Negative for cough and shortness of breath.   Cardiovascular: Negative for chest pain and leg swelling.  Endocrine: Negative for cold intolerance.  Musculoskeletal: Positive for arthralgias and myalgias.  Allergic/Immunologic: Negative for environmental allergies.  Neurological: Negative for numbness.    Past Medical History:  Diagnosis Date  . Atrial fibrillation (HCC)    CHADSVASC score 1  . Esophageal stricture    Multiple dilations  . GERD (gastroesophageal reflux disease)   . Hyperlipidemia   . Hypertension     Past Surgical History:  Procedure Laterality Date  . Lumbar luminectomy  2007   Dr. Coletta Memos  . Partial finger amputation      Family History  Problem Relation Age of Onset  . Heart failure Father     Died age 20  . Heart attack Brother     Age 1  . Aortic stenosis Brother     Bicuspid valve status post AVR    Social History Social History  Substance Use Topics  . Smoking status: Former Smoker    Packs/day: 0.50    Years: 15.00    Types: Cigarettes    Quit date: 12/30/2008  . Smokeless tobacco: Current User    Types: Snuff, Chew     Comment: dips 2 cans/snuff per week  . Alcohol use No    No Known Allergies  Current Outpatient Prescriptions  Medication Sig Dispense Refill  . aspirin 81 MG tablet Take 81 mg by mouth daily.    Marland Kitchen diltiazem (CARDIZEM) 30 MG tablet Take 1 tablet (30 mg  total) by mouth every 6 (six) hours as needed. PALPITATIONS (Patient taking differently: Take 30 mg by mouth as needed. PALPITATIONS) 30 tablet 3  . ezetimibe (ZETIA) 10 MG tablet Take 10 mg by mouth daily.  5  . flecainide (TAMBOCOR) 100 MG tablet Take 1 tablet (100 mg total) by mouth 2 (two) times daily. 180 tablet 3  . HYDROcodone-acetaminophen (NORCO) 7.5-325 MG tablet Take 1 tablet by mouth every 4 (four) hours as needed for moderate pain (Must last 30 days.  Do not drive or operate machinery while taking this medicine.). 120 tablet 0  . nebivolol (BYSTOLIC) 10 MG tablet Take 10 mg by mouth daily.      . pantoprazole (PROTONIX) 40 MG tablet Take 40 mg by mouth daily.    . tamsulosin (FLOMAX) 0.4 MG CAPS capsule Take 1 capsule by mouth at bedtime.     No current facility-administered medications for this visit.      Physical Exam  Blood pressure (!) 153/94, pulse 64, temperature 97.3 F (36.3 C), resp. rate 16, height  (1.803 m), weight 184 lb (83.5 kg).  Constitutional: overall normal hygiene, normal nutrition, well developed, normal grooming, normal body habitus. Assistive device:none  Musculoskeletal: gait and station Limp none, muscle tone and strength  are normal, no tremors or atrophy is present.  .  Neurological: coordination overall normal.  Deep tendon reflex/nerve stretch intact.  Sensation normal.  Cranial nerves II-XII intact.   Skin:   normal overall no scars, lesions, ulcers or rashes. No psoriasis.  Psychiatric: Alert and oriented x 3.  Recent memory intact, remote memory unclear.  Normal mood and affect. Well groomed.  Good eye contact.  Cardiovascular: overall no swelling, no varicosities, no edema bilaterally, normal temperatures of the legs and arms, no clubbing, cyanosis and good capillary refill.  Lymphatic: palpation is normal.  Examination of right Upper Extremity is done.  Inspection:   Overall:  Elbow non-tender without crepitus or defects, forearm  non-tender without crepitus or defects, wrist non-tender without crepitus or defects, hand non-tender.    Shoulder: with glenohumeral joint tenderness, without effusion.   Upper arm: without swelling and tenderness   Range of motion:   Overall:  Full range of motion of the elbow, full range of motion of wrist and full range of motion in fingers.   Shoulder:  right  180 degrees forward flexion; 165 degrees abduction; 35 degrees internal rotation, 35 degrees external rotation, 15 degrees extension, 40 degrees adduction.   Stability:   Overall:  Shoulder, elbow and wrist stable   Strength and Tone:   Overall full shoulder muscles strength, full upper arm strength and normal upper arm bulk and tone.   The patient has been educated about the nature of the problem(s) and counseled on treatment options.  The patient appeared to understand what I have discussed and is in agreement with it.  Encounter Diagnoses  Name Primary?  . Right shoulder pain Yes  . HYPERTENSION, BENIGN     PLAN Call if any problems.  Precautions discussed.  Continue current medications.   Return to clinic 3 months   Electronically Signed Darreld Mclean, MD 8/3/20178:57 AM

## 2015-12-07 ENCOUNTER — Telehealth: Payer: Self-pay | Admitting: Orthopaedic Surgery

## 2015-12-07 MED ORDER — HYDROCODONE-ACETAMINOPHEN 7.5-325 MG PO TABS: 1.0000 | ORAL_TABLET | Freq: Four times a day (QID) | ORAL | 0 refills | Status: DC | PRN

## 2015-12-22 ENCOUNTER — Other Ambulatory Visit: Payer: Self-pay | Admitting: Cardiology

## 2016-01-04 ENCOUNTER — Other Ambulatory Visit: Payer: Self-pay | Admitting: *Deleted

## 2016-01-04 ENCOUNTER — Other Ambulatory Visit: Payer: Self-pay | Admitting: Orthopaedic Surgery

## 2016-01-04 MED ORDER — HYDROCODONE-ACETAMINOPHEN 7.5-325 MG PO TABS: 1.0000 | ORAL_TABLET | Freq: Four times a day (QID) | ORAL | 0 refills | Status: DC | PRN

## 2016-01-05 ENCOUNTER — Encounter: Payer: Self-pay | Admitting: Orthopaedic Surgery

## 2016-01-31 ENCOUNTER — Telehealth: Payer: Self-pay | Admitting: Orthopedic Surgery

## 2016-02-01 ENCOUNTER — Ambulatory Visit: Payer: BLUE CROSS/BLUE SHIELD | Admitting: Orthopaedic Surgery

## 2016-02-01 ENCOUNTER — Other Ambulatory Visit: Payer: Self-pay | Admitting: Orthopedic Surgery

## 2016-02-05 MED ORDER — HYDROCODONE-ACETAMINOPHEN 7.5-325 MG PO TABS: 1.0000 | ORAL_TABLET | Freq: Four times a day (QID) | ORAL | 0 refills | Status: DC | PRN

## 2016-02-06 ENCOUNTER — Ambulatory Visit (INDEPENDENT_AMBULATORY_CARE_PROVIDER_SITE_OTHER): Payer: BLUE CROSS/BLUE SHIELD | Admitting: Orthopaedic Surgery

## 2016-02-06 ENCOUNTER — Encounter: Payer: Self-pay | Admitting: Orthopaedic Surgery

## 2016-02-06 VITALS — BP 136/81 | HR 68 | Temp 97.3°F | Ht 71.0 in | Wt 187.6 lb

## 2016-02-06 DIAGNOSIS — I10 Essential (primary) hypertension: Secondary | ICD-10-CM

## 2016-02-06 DIAGNOSIS — G8929 Other chronic pain: Secondary | ICD-10-CM | POA: Diagnosis not present

## 2016-02-06 DIAGNOSIS — M25511 Pain in right shoulder: Secondary | ICD-10-CM | POA: Diagnosis not present

## 2016-02-06 DIAGNOSIS — I480 Paroxysmal atrial fibrillation: Secondary | ICD-10-CM

## 2016-02-06 NOTE — Progress Notes (Signed)
Patient ID:Wesley [name], male DOB:08/09/1960, 55 y.o. ZOX:[id number]  Chief Complaint  Patient presents with  . Follow-up    Right shoulder pain    HPI  Wesley [name] is a 55 y.o. male who has chronic pain of the right shoulder.  He is doing his exercises, he is taking his medicine. He has no swelling or redness or paresthesias.  He has no new trauma. HPI  Body mass index is 26.16 kg/m.  ROS  Review of Systems  HENT: Negative for congestion.   Respiratory: Negative for cough and shortness of breath.   Cardiovascular: Negative for chest pain and leg swelling.  Endocrine: Negative for cold intolerance.  Musculoskeletal: Positive for arthralgias and myalgias.  Allergic/Immunologic: Negative for environmental allergies.  Neurological: Negative for numbness.    Past Medical History:  Diagnosis Date  . Atrial fibrillation (HCC)    CHADSVASC score 1  . Esophageal stricture    Multiple dilations  . GERD (gastroesophageal reflux disease)   . Hyperlipidemia   . Hypertension     Past Surgical History:  Procedure Laterality Date  . Lumbar luminectomy  2007   Dr. Coletta MemosKyle [name]  . Partial finger amputation      Family History  Problem Relation Age of Onset  . Heart failure Father     Died age 55  . Heart attack Brother     Age 55  . Aortic stenosis Brother     Bicuspid valve status post AVR    Social History Social History  Substance Use Topics  . Smoking status: Former Smoker    Packs/day: 0.50    Years: 15.00    Types: Cigarettes    Quit date: 12/30/2008  . Smokeless tobacco: Current User    Types: Snuff, Chew     Comment: dips 2 cans/snuff per week  . Alcohol use No    No Known Allergies  Current Outpatient Prescriptions  Medication Sig Dispense Refill  . aspirin 81 MG tablet Take 81 mg by mouth daily.    Marland Kitchen. diltiazem (CARDIZEM) 30 MG tablet Take 1 tablet (30 mg total) by mouth every 6 (six) hours as needed. PALPITATIONS (Patient taking differently: Take 30  mg by mouth as needed. PALPITATIONS) 30 tablet 3  . ezetimibe (ZETIA) 10 MG tablet Take 10 mg by mouth daily.  5  . flecainide (TAMBOCOR) 100 MG tablet TAKE 1 TABLET (100 MG TOTAL) BY MOUTH 2 (TWO) TIMES DAILY. 180 tablet 3  . HYDROcodone-acetaminophen (NORCO) 7.5-325 MG tablet Take 1 tablet by mouth every 6 (six) hours as needed for moderate pain (Must last 30 days.Do not drive or operate machinery while taking this medicine.). 100 tablet 0  . nebivolol (BYSTOLIC) 10 MG tablet Take 10 mg by mouth daily.      . pantoprazole (PROTONIX) 40 MG tablet Take 40 mg by mouth daily.    . tamsulosin (FLOMAX) 0.4 MG CAPS capsule Take 1 capsule by mouth at bedtime.     No current facility-administered medications for this visit.      Physical Exam  Blood pressure 136/81, pulse 68, temperature 97.3 F (36.3 C), height 5\' 11"  (1.803 m), weight 187 lb 9.6 oz (85.1 kg).  Constitutional: overall normal hygiene, normal nutrition, well developed, normal grooming, normal body habitus. Assistive device:none  Musculoskeletal: gait and station Limp none, muscle tone and strength are normal, no tremors or atrophy is present.  .  Neurological: coordination overall normal.  Deep tendon reflex/nerve stretch intact.  Sensation normal.  Cranial  nerves II-XII intact.   Skin:   Normal overall no scars, lesions, ulcers or rashes. No psoriasis.  Psychiatric: Alert and oriented x 3.  Recent memory intact, remote memory unclear.  Normal mood and affect. Well groomed.  Good eye contact.  Cardiovascular: overall no swelling, no varicosities, no edema bilaterally, normal temperatures of the legs and arms, no clubbing, cyanosis and good capillary refill.  Lymphatic: palpation is normal.  Examination of right Upper Extremity is done.  Inspection:   Overall:  Elbow non-tender without crepitus or defects, forearm non-tender without crepitus or defects, wrist non-tender without crepitus or defects, hand  non-tender.    Shoulder: with glenohumeral joint tenderness, without effusion.   Upper arm: without swelling and tenderness   Range of motion:   Overall:  Full range of motion of the elbow, full range of motion of wrist and full range of motion in fingers.   Shoulder:  right  180 degrees forward flexion; 165 degrees abduction; 40 degrees internal rotation, 40 degrees external rotation, 20 degrees extension, 40 degrees adduction.   Stability:   Overall:  Shoulder, elbow and wrist stable   Strength and Tone:   Overall full shoulder muscles strength, full upper arm strength and normal upper arm bulk and tone.   The patient has been educated about the nature of the problem(s) and counseled on treatment options.  The patient appeared to understand what I have discussed and is in agreement with it.  Encounter Diagnoses  Name Primary?  . Chronic right shoulder pain Yes  . HYPERTENSION, BENIGN   . Paroxysmal atrial fibrillation (HCC)     PLAN Call if any problems.  Precautions discussed.  Continue current medications.   Return to clinic 3 months   Electronically Signed Darreld McleanWayne Chandler Stofer, MD 11/7/20178:34 AM

## 2016-02-28 DIAGNOSIS — J060 Acute laryngopharyngitis: Secondary | ICD-10-CM | POA: Diagnosis not present

## 2016-02-28 DIAGNOSIS — R509 Fever, unspecified: Secondary | ICD-10-CM | POA: Diagnosis not present

## 2016-02-28 DIAGNOSIS — R070 Pain in throat: Secondary | ICD-10-CM | POA: Diagnosis not present

## 2016-02-28 DIAGNOSIS — H9313 Tinnitus, bilateral: Secondary | ICD-10-CM | POA: Diagnosis not present

## 2016-03-05 ENCOUNTER — Telehealth: Payer: Self-pay | Admitting: Orthopaedic Surgery

## 2016-03-05 MED ORDER — HYDROCODONE-ACETAMINOPHEN 7.5-325 MG PO TABS: 1.0000 | ORAL_TABLET | Freq: Four times a day (QID) | ORAL | 0 refills | Status: DC | PRN

## 2016-04-02 ENCOUNTER — Telehealth: Payer: Self-pay | Admitting: Orthopaedic Surgery

## 2016-04-02 MED ORDER — HYDROCODONE-ACETAMINOPHEN 7.5-325 MG PO TABS: 1.0000 | ORAL_TABLET | Freq: Four times a day (QID) | ORAL | 0 refills | Status: DC | PRN

## 2016-05-08 ENCOUNTER — Ambulatory Visit: Payer: BLUE CROSS/BLUE SHIELD | Admitting: Orthopaedic Surgery

## 2016-05-22 ENCOUNTER — Encounter: Payer: Self-pay | Admitting: Orthopaedic Surgery

## 2016-05-22 ENCOUNTER — Ambulatory Visit: Payer: BLUE CROSS/BLUE SHIELD | Admitting: Orthopaedic Surgery

## 2016-09-09 DIAGNOSIS — J40 Bronchitis, not specified as acute or chronic: Secondary | ICD-10-CM | POA: Diagnosis not present

## 2016-09-09 DIAGNOSIS — J111 Influenza due to unidentified influenza virus with other respiratory manifestations: Secondary | ICD-10-CM | POA: Diagnosis not present

## 2016-10-28 DIAGNOSIS — E782 Mixed hyperlipidemia: Secondary | ICD-10-CM | POA: Diagnosis not present

## 2016-10-28 DIAGNOSIS — I10 Essential (primary) hypertension: Secondary | ICD-10-CM | POA: Diagnosis not present

## 2016-10-28 DIAGNOSIS — R7301 Impaired fasting glucose: Secondary | ICD-10-CM | POA: Diagnosis not present

## 2016-10-30 DIAGNOSIS — K219 Gastro-esophageal reflux disease without esophagitis: Secondary | ICD-10-CM | POA: Diagnosis not present

## 2016-10-30 DIAGNOSIS — I480 Paroxysmal atrial fibrillation: Secondary | ICD-10-CM | POA: Diagnosis not present

## 2016-10-30 DIAGNOSIS — R7303 Prediabetes: Secondary | ICD-10-CM | POA: Diagnosis not present

## 2016-10-30 DIAGNOSIS — Z0000 Encounter for general adult medical examination without abnormal findings: Secondary | ICD-10-CM | POA: Diagnosis not present

## 2016-11-01 ENCOUNTER — Encounter (INDEPENDENT_AMBULATORY_CARE_PROVIDER_SITE_OTHER): Payer: Self-pay | Admitting: *Deleted

## 2016-12-09 ENCOUNTER — Ambulatory Visit (INDEPENDENT_AMBULATORY_CARE_PROVIDER_SITE_OTHER): Payer: BLUE CROSS/BLUE SHIELD | Admitting: Cardiology

## 2016-12-09 ENCOUNTER — Encounter: Payer: Self-pay | Admitting: Cardiology

## 2016-12-09 ENCOUNTER — Encounter: Payer: Self-pay | Admitting: *Deleted

## 2016-12-09 VITALS — BP 118/72 | HR 68 | Ht 71.0 in | Wt 199.6 lb

## 2016-12-09 DIAGNOSIS — I10 Essential (primary) hypertension: Secondary | ICD-10-CM | POA: Diagnosis not present

## 2016-12-09 DIAGNOSIS — I480 Paroxysmal atrial fibrillation: Secondary | ICD-10-CM

## 2016-12-09 NOTE — Progress Notes (Signed)
Cardiology Office Note  Date: 12/09/2016   ID: [name], DOB 1960-05-23, MRN [id number]  PCP: Benita Stabile, MD  Primary Cardiologist: Nona Dell, MD   Chief Complaint  Patient presents with  . PAF    History of Present Illness: Wesley [name] is a 56 y.o. male last seen in June 2017. He presents for a routine follow-up visit. Continues to work full time, reports no prolonged palpitations or chest pain in the interim.  He states that he has been compliant with his medications. Cardiac regimen includes flecainide, Bystolic, and aspirin. He has short acting Cardizem to use but has not had to do this with any significant frequency. CHADSVASC score is 1.  I personally reviewed his ECG today which shows sinus rhythm with nonspecific QRS widening and nonspecific T-wave changes.  He continues to follow with Dr. Margo Aye, had a recent physical with lab work. Results being requested.  Past Medical History:  Diagnosis Date  . Atrial fibrillation (HCC)    CHADSVASC score 1  . Esophageal stricture    Multiple dilations  . GERD (gastroesophageal reflux disease)   . Hyperlipidemia   . Hypertension     Past Surgical History:  Procedure Laterality Date  . Lumbar luminectomy  2007   Dr. Coletta Memos  . Partial finger amputation      Current Outpatient Prescriptions  Medication Sig Dispense Refill  . aspirin 81 MG tablet Take 81 mg by mouth daily.    Marland Kitchen diltiazem (CARDIZEM) 30 MG tablet Take 1 tablet (30 mg total) by mouth every 6 (six) hours as needed. PALPITATIONS (Patient taking differently: Take 30 mg by mouth as needed. PALPITATIONS) 30 tablet 3  . ezetimibe (ZETIA) 10 MG tablet Take 10 mg by mouth daily.  5  . flecainide (TAMBOCOR) 100 MG tablet TAKE 1 TABLET (100 MG TOTAL) BY MOUTH 2 (TWO) TIMES DAILY. 180 tablet 3  . nebivolol (BYSTOLIC) 10 MG tablet Take 10 mg by mouth daily.      . pantoprazole (PROTONIX) 40 MG tablet Take 40 mg by mouth daily.    . tamsulosin (FLOMAX)  0.4 MG CAPS capsule Take 1 capsule by mouth at bedtime.     No current facility-administered medications for this visit.    Allergies:  Patient has no known allergies.   Social History: The patient  reports that he quit smoking about 7 years ago. His smoking use included Cigarettes. He has a 7.50 pack-year smoking history. His smokeless tobacco use includes Snuff and Chew. He reports that he does not drink alcohol or use drugs.   ROS:  Please see the history of present illness. Otherwise, complete review of systems is positive for occasional intermittent weakness.  All other systems are reviewed and negative.   Physical Exam: VS:  BP 118/72   Pulse 68   Ht  (1.803 m)   Wt 199 lb 9.6 oz (90.5 kg)   SpO2 96%   BMI 27.84 kg/m , BMI Body mass index is 27.84 kg/m.  Wt Readings from Last 3 Encounters:  12/09/16 199 lb 9.6 oz (90.5 kg)  02/06/16 187 lb 9.6 oz (85.1 kg)  11/02/15 184 lb (83.5 kg)    Gen.: Patient appears comfortable at rest.  HEENT: Conjunctivae and lids normal, oropharynx.  Neck: Supple, no elevated JVP or bruits, no thyromegaly.  Lungs: Clear to auscultation, nonlabored.  Cardiac: Regular rate and rhythm, no rub or S3 gallop. Soft systolic murmur at the base, no loud diastolic  murmur.  Abdomen: Soft, nontender, bowel sounds present.  Skin: Warm and dry.  Musculoskeletal: No kyphosis.  Extremities: No pitting edema, distal pulses full.   ECG: I personally reviewed the tracing from 6/22/317 showed normal sinus rhythm.  Other Studies Reviewed Today:  Echocardiogram 05/10/2010 [facility]): Moderate basal septal LV hypertrophy, LVEF 55-60%, mild aortic regurgitation.  Exercise Myoview 05/10/2010 [facility]([place]): No diagnostic ST segment changes at 12.8 METS. No chest pain. No ischemic perfusion defects with LVEF 59%.  Assessment and Plan:  1. Paroxysmal atrial fibrillation. Patient has done very well with rhythm control on flecainide and continues on aspirin  with relatively low thromboembolic risk score. I reviewed his ECG today.  2. Essential hypertension by history, blood pressure is normal today.  Current medicines were reviewed with the patient today.   Orders Placed This Encounter  Procedures  . EKG 12-Lead    Disposition: Follow-up in one year, sooner if needed.  Signed, Jonelle SidleSamuel G. Marsden Zaino, MD, [facility] 12/09/2016 4:00 PM    [facility] [address] [place], [place], [place] [address] Phone: [phone]; Fax: [phone]

## 2016-12-09 NOTE — Patient Instructions (Signed)

## 2017-03-20 ENCOUNTER — Telehealth: Payer: Self-pay | Admitting: *Deleted

## 2017-03-20 MED ORDER — FLECAINIDE ACETATE 100 MG PO TABS: 100.0000 mg | ORAL_TABLET | Freq: Two times a day (BID) | ORAL | 1 refills | Status: DC

## 2017-03-20 NOTE — Telephone Encounter (Signed)
Medication sent to pharmacy  

## 2017-05-07 DIAGNOSIS — E782 Mixed hyperlipidemia: Secondary | ICD-10-CM | POA: Diagnosis not present

## 2017-05-07 DIAGNOSIS — R7301 Impaired fasting glucose: Secondary | ICD-10-CM | POA: Diagnosis not present

## 2017-05-07 DIAGNOSIS — I10 Essential (primary) hypertension: Secondary | ICD-10-CM | POA: Diagnosis not present

## 2017-05-12 DIAGNOSIS — R7303 Prediabetes: Secondary | ICD-10-CM | POA: Diagnosis not present

## 2017-05-12 DIAGNOSIS — H9319 Tinnitus, unspecified ear: Secondary | ICD-10-CM | POA: Diagnosis not present

## 2017-05-12 DIAGNOSIS — Z6827 Body mass index (BMI) 27.0-27.9, adult: Secondary | ICD-10-CM | POA: Diagnosis not present

## 2017-09-21 ENCOUNTER — Other Ambulatory Visit: Payer: Self-pay | Admitting: Cardiology

## 2017-12-12 DIAGNOSIS — R079 Chest pain, unspecified: Secondary | ICD-10-CM | POA: Diagnosis not present

## 2017-12-12 DIAGNOSIS — Z6827 Body mass index (BMI) 27.0-27.9, adult: Secondary | ICD-10-CM | POA: Diagnosis not present

## 2017-12-12 DIAGNOSIS — J060 Acute laryngopharyngitis: Secondary | ICD-10-CM | POA: Diagnosis not present

## 2018-01-30 ENCOUNTER — Other Ambulatory Visit: Payer: Self-pay

## 2018-01-30 ENCOUNTER — Emergency Department (HOSPITAL_COMMUNITY): Payer: BLUE CROSS/BLUE SHIELD

## 2018-01-30 ENCOUNTER — Encounter (HOSPITAL_COMMUNITY): Payer: Self-pay | Admitting: Emergency Medicine

## 2018-01-30 ENCOUNTER — Emergency Department (HOSPITAL_COMMUNITY)
Admission: EM | Admit: 2018-01-30 | Discharge: 2018-01-30 | Disposition: A | Discharge status: Home / Self Care | Payer: BLUE CROSS/BLUE SHIELD | Attending: Emergency Medicine | Admitting: Emergency Medicine

## 2018-01-30 DIAGNOSIS — R079 Chest pain, unspecified: Secondary | ICD-10-CM | POA: Diagnosis not present

## 2018-01-30 DIAGNOSIS — I10 Essential (primary) hypertension: Secondary | ICD-10-CM | POA: Insufficient documentation

## 2018-01-30 DIAGNOSIS — Z87891 Personal history of nicotine dependence: Secondary | ICD-10-CM | POA: Insufficient documentation

## 2018-01-30 DIAGNOSIS — Z7982 Long term (current) use of aspirin: Secondary | ICD-10-CM | POA: Diagnosis not present

## 2018-01-30 DIAGNOSIS — Z79899 Other long term (current) drug therapy: Secondary | ICD-10-CM | POA: Diagnosis not present

## 2018-01-30 DIAGNOSIS — R000 Tachycardia, unspecified: Secondary | ICD-10-CM | POA: Diagnosis not present

## 2018-01-30 DIAGNOSIS — I480 Paroxysmal atrial fibrillation: Secondary | ICD-10-CM | POA: Diagnosis not present

## 2018-01-30 DIAGNOSIS — I4891 Unspecified atrial fibrillation: Secondary | ICD-10-CM | POA: Diagnosis not present

## 2018-01-30 LAB — CBC
HEMATOCRIT: 48.9 % (ref 39.0–52.0)
HEMOGLOBIN: 15.3 g/dL (ref 13.0–17.0)
MCH: 29.6 pg (ref 26.0–34.0)
MCHC: 31.3 g/dL (ref 30.0–36.0)
MCV: 94.6 fL (ref 80.0–100.0)
NRBC: 0 % (ref 0.0–0.2)
Platelets: 238 10*3/uL (ref 150–400)
RBC: 5.17 MIL/uL (ref 4.22–5.81)
RDW: 13 % (ref 11.5–15.5)
WBC: 7.7 10*3/uL (ref 4.0–10.5)

## 2018-01-30 LAB — BASIC METABOLIC PANEL
Anion gap: 9 (ref 5–15)
BUN: 12 mg/dL (ref 6–20)
CO2: 29 mmol/L (ref 22–32)
Calcium: 9.3 mg/dL (ref 8.9–10.3)
Chloride: 102 mmol/L (ref 98–111)
Creatinine, Ser: 1.35 mg/dL — ABNORMAL HIGH (ref 0.61–1.24)
GFR calc Af Amer: >60 mL/min (ref >60)
GFR calc non Af Amer: 57 mL/min — ABNORMAL LOW (ref >60)
Glucose, Bld: 115 mg/dL — ABNORMAL HIGH (ref 70–99)
Potassium: 3.3 mmol/L — ABNORMAL LOW (ref 3.5–5.1)
Sodium: 140 mmol/L (ref 135–145)

## 2018-01-30 LAB — TROPONIN I: Troponin I: <0.03 ng/mL (ref <0.03)

## 2018-01-30 MED ORDER — METOPROLOL TARTRATE 5 MG/5ML IV SOLN
INTRAVENOUS | Status: AC
  Filled 2018-01-30: qty 5

## 2018-01-30 MED ORDER — APIXABAN 5 MG PO TABS: 5.0000 mg | ORAL_TABLET | Freq: Two times a day (BID) | ORAL | 0 refills | Status: DC

## 2018-01-30 MED ORDER — METOPROLOL TARTRATE 5 MG/5ML IV SOLN
5.0000 mg | Freq: Once | INTRAVENOUS | Status: AC
  Administered 2018-01-30: 5 mg via INTRAVENOUS
  Filled 2018-01-30: qty 5

## 2018-01-30 MED ORDER — DILTIAZEM HCL 30 MG PO TABS: 30.0000 mg | ORAL_TABLET | Freq: Four times a day (QID) | ORAL | 0 refills | Status: AC | PRN

## 2018-01-30 MED ORDER — POTASSIUM CHLORIDE CRYS ER 20 MEQ PO TBCR
40.0000 meq | EXTENDED_RELEASE_TABLET | Freq: Once | ORAL | Status: AC
  Administered 2018-01-30: 40 meq via ORAL
  Filled 2018-01-30: qty 2

## 2018-01-30 MED ORDER — APIXABAN 5 MG PO TABS
5.0000 mg | ORAL_TABLET | Freq: Once | ORAL | Status: AC
  Administered 2018-01-30: 5 mg via ORAL
  Filled 2018-01-30: qty 1

## 2018-01-30 MED ORDER — METOPROLOL TARTRATE 5 MG/5ML IV SOLN
2.5000 mg | Freq: Once | INTRAVENOUS | Status: AC
  Administered 2018-01-30: 2.5 mg via INTRAVENOUS

## 2018-01-30 NOTE — ED Provider Notes (Signed)
[facility] EMERGENCY DEPARTMENT Provider Note   CSN: [id number] Arrival date & time: 01/30/18  0106  Time seen 01:37 AM   History   Chief Complaint Chief Complaint  Patient presents with  . Chest Pain    HPI Wesley [name] is a 57 y.o. male.  HPI patient states he has had a history of atrial fibrillation for about the past 3 years.  He is followed by Dr. Diona Browner.  He states since he was started on flecainide it is been fairly well controlled.  He states sometimes at night when he gets home from work he has to take an extra 50 mg of flecainide and his heart rate will improve in about 15 minutes.  However tonight he was watching TV about 9:30 PM and he had the feeling of his heart starting to race.  He states shortly after it started he had a pressure feeling in his chest and the right side of his neck.  He denies shortness of breath or diaphoresis.  He denies nausea or vomiting or swelling or pain in his legs.  He states he took a 50 mg flecainide without improvement and then took a 30 mg of his Cardizem, again without improvement.  At this point he felt he should come to the emergency department.  He states when he checked his blood pressure at home it was a little bit elevated and the highest heart rate he had just prior to coming to the ED was 148.  Patient states he takes a baby aspirin a day, he used to be on Coumadin but he stopped taking it after his cardiologist told him  he could take the aspirin.  Patient also relates about 2 weeks ago he was helping his son move some furniture during the heat and he got very diaphoretic and short of breath and felt hot but got better once he sat and rested.  PCP Benita Stabile, MD Cardiology Dr Diona Browner  Past Medical History:  Diagnosis Date  . Atrial fibrillation (HCC)    CHADSVASC score 1  . Esophageal stricture    Multiple dilations  . GERD (gastroesophageal reflux disease)   . Hyperlipidemia   . Hypertension     Patient Active Problem  List   Diagnosis Date Noted  . Aortic valve disorders 05/08/2010  . Paroxysmal atrial fibrillation (HCC) 05/08/2010  . HYPERLIPIDEMIA-MIXED 05/07/2010  . HYPERTENSION, BENIGN 05/07/2010  . Palpitations 05/07/2010    Past Surgical History:  Procedure Laterality Date  . Lumbar luminectomy  2007   Dr. Coletta Memos  . Partial finger amputation          Home Medications    Prior to Admission medications   Medication Sig Start Date End Date Taking? Authorizing Provider  apixaban (ELIQUIS) 5 MG TABS tablet Take 1 tablet (5 mg total) by mouth 2 (two) times daily. 01/30/18 03/01/18  Devoria Albe, MD  aspirin 81 MG tablet Take 81 mg by mouth daily.    [provider]  diltiazem (CARDIZEM) 30 MG tablet Take 1 tablet (30 mg total) by mouth every 6 (six) hours as needed. PALPITATIONS 01/30/18   Devoria Albe, MD  ezetimibe (ZETIA) 10 MG tablet Take 10 mg by mouth daily. 08/21/15   [provider]  flecainide (TAMBOCOR) 100 MG tablet TAKE 1 TABLET BY MOUTH TWICE A DAY 09/22/17   Jonelle Sidle, MD  nebivolol (BYSTOLIC) 10 MG tablet Take 10 mg by mouth daily.      [provider]  pantoprazole (PROTONIX) 40 MG tablet Take 40 mg by mouth daily.    [provider]  tamsulosin (FLOMAX) 0.4 MG CAPS capsule Take 1 capsule by mouth at bedtime. 08/14/15   [provider]    Family History Family History  Problem Relation Age of Onset  . Heart failure Father        Died age 52  . Heart attack Brother        Age 64  . Aortic stenosis Brother        Bicuspid valve status post AVR    Social History Social History   Tobacco Use  . Smoking status: Former Smoker    Packs/day: 0.50    Years: 15.00    Pack years: 7.50    Types: Cigarettes    Last attempt to quit: 12/30/2008    Years since quitting: 9.0  . Smokeless tobacco: Current User    Types: Snuff, Chew  . Tobacco comment: dips 2 cans/snuff per week  Substance Use Topics  . Alcohol use: No     Alcohol/week: 0.0 standard drinks  . Drug use: No  employed Lives at home   Allergies   Patient has no known allergies.   Review of Systems Review of Systems  All other systems reviewed and are negative.    Physical Exam Updated Vital Signs BP (!) 137/93   Pulse (!) 102   Temp 97.9 F (36.6 C) (Oral)   Resp 13   Wt 90.5 kg   SpO2 96%   BMI 27.83 kg/m   Vital signs normal except tachycardia   Physical Exam  Constitutional: He appears well-developed and well-nourished. He is cooperative.  HENT:  Head: Normocephalic and atraumatic.  Eyes: Pupils are equal, round, and reactive to light. Conjunctivae and EOM are normal.  Neck: Normal range of motion and full passive range of motion without pain. Neck supple.  Cardiovascular: Normal heart sounds. An irregularly irregular rhythm present. Tachycardia present.  When I first went into the room his heart rate was around 110 however during our conversation it bumped up into the 130s  Pulmonary/Chest: Effort normal and breath sounds normal. No tachypnea. No respiratory distress.  Neurological: He has normal strength and normal reflexes. No cranial nerve deficit.  Skin: Skin is warm, dry and intact.  Psychiatric: He has a normal mood and affect. His speech is normal and behavior is normal. Thought content normal.  Vitals reviewed.    ED Treatments / Results  Labs (all labs ordered are listed, but only abnormal results are displayed) Results for orders placed or performed during the hospital encounter of 01/30/18  Basic metabolic panel  Result Value Ref Range   Sodium 140 135 - 145 mmol/L   Potassium 3.3 (L) 3.5 - 5.1 mmol/L   Chloride 102 98 - 111 mmol/L   CO2 29 22 - 32 mmol/L   Glucose, Bld 115 (H) 70 - 99 mg/dL   BUN 12 6 - 20 mg/dL   Creatinine, Ser 1.61 (H) 0.61 - 1.24 mg/dL   Calcium 9.3 8.9 - 09.6 mg/dL   GFR calc non Af Amer 57 (L) >60 mL/min   GFR calc Af Amer >60 >60 mL/min   Anion gap 9 5 - 15  CBC    Result Value Ref Range   WBC 7.7 4.0 - 10.5 K/uL   RBC 5.17 4.22 - 5.81 MIL/uL   Hemoglobin 15.3 13.0 - 17.0 g/dL   HCT 04.5 40.9 - 81.1 %   MCV 94.6  80.0 - 100.0 fL   MCH 29.6 26.0 - 34.0 pg   MCHC 31.3 30.0 - 36.0 g/dL   RDW 40.9 81.1 - 91.4 %   Platelets 238 150 - 400 K/uL   nRBC 0.0 0.0 - 0.2 %  Troponin I  Result Value Ref Range   Troponin I <0.03 <0.03 ng/mL      EKG EKG Interpretation  Date/Time:  Friday January 30 2018 01:13:28 EDT Ventricular Rate:  106 PR Interval:    QRS Duration: 112 QT Interval:  323 QTC Calculation: 429 R Axis:   23 Text Interpretation:  Atrial flutter with predominant 2:1 AV block Ventricular premature complex Borderline intraventricular conduction delay Borderline repolarization abnormality Baseline wander in lead(s) V1 No significant change since last tracing 12 Jul 2012 Confirmed by Devoria Albe ([id number]) on 01/30/2018 1:28:11 AM   Radiology Dg Chest 2 View  Result Date: 01/30/2018 CLINICAL DATA:  57 year old male with chest pain. EXAM: CHEST - 2 VIEW COMPARISON:  Chest radiograph dated 07/12/2012 FINDINGS: The lungs are clear. There is no pleural effusion or pneumothorax. The cardiac silhouette is within normal limits. No acute osseous pathology. Partially visualized lower thoracic/upper lumbar posterior fixation hardware. IMPRESSION: No active cardiopulmonary disease. Electronically Signed   By: Elgie Collard M.D.   On: 01/30/2018 02:08    Procedures .Critical Care Performed by: Devoria Albe, MD Authorized by: Devoria Albe, MD   Critical care provider statement:    Critical care time (minutes):  31   Critical care was necessary to treat or prevent imminent or life-threatening deterioration of the following conditions:  Circulatory failure   Critical care was time spent personally by me on the following activities:  Discussions with consultants, examination of patient, obtaining history from patient or surrogate, ordering and review of  laboratory studies, ordering and review of radiographic studies, pulse oximetry and re-evaluation of patient's condition   (including critical care time)  Medications Ordered in ED Medications  metoprolol tartrate (LOPRESSOR) injection 5 mg (5 mg Intravenous Given 01/30/18 0151)  potassium chloride SA (K-DUR,KLOR-CON) CR tablet 40 mEq (40 mEq Oral Given 01/30/18 0219)  metoprolol tartrate (LOPRESSOR) injection 2.5 mg (2.5 mg Intravenous Given 01/30/18 0223)  apixaban (ELIQUIS) tablet 5 mg (5 mg Oral Given 01/30/18 0318)     Initial Impression / Assessment and Plan / ED Course  I have reviewed the triage vital signs and the nursing notes.  Pertinent labs & imaging results that were available during my care of the patient were reviewed by me and considered in my medical decision making (see chart for details).     Patient was given Lopressor IV for his atrial fib/flutter to control his rate.   Laboratory testing was done.  Recheck at 2:20 AM patient's heart rate now is in the 90-110 range.  He states he is feeling better.  He still has a irregularity on the monitor.  On his blood work he was noted to be mildly hypokalemic and he was given oral potassium.  He was given an additional dose of Lopressor but only 2.5 mg IV.  Recheck at 3:20 AM heart rate is in the 80 to 90s range.  Patient states he feels better.  However he remains in atrial fib flutter.  I am going to talk to the cardiologist but meanwhile I am going to go ahead and start him on Eliquis and patient is agreeable.  Concern is that he could have a stroke when he converts.  03:40 AM Patient was discussed with  cardiologist, Dr. Deforest Hoyles, he agrees with starting the Eliquis.  When I look at his records he is only taking the Cardizem as needed.  He suggested that he take it 4 times a day today and possibly tomorrow unless his heart rate less than 60 and then as needed for heart rate over 100.  He can call his cardiologist office to follow-up  with next week.  3:45 a.m.  When I enter the room patient's heart rate is in the 80s to 90s but still in the irregular rhythm.  We discussed his discharge plans and he is agreeable.  When I look at his Cardizem pill bottle it is actually 57 years old.  I will renew that prescription for him.  Patient already has an appointment to see his cardiologist for his yearly checkup on November 11.  We discussed calling the  the office tomorrow to make sure they did not need to see him sooner.  Final Clinical Impressions(s) / ED Diagnoses   Final diagnoses:  Atrial fibrillation with rapid ventricular response [facility])    ED Discharge Orders         Ordered    diltiazem (CARDIZEM) 30 MG tablet  Every 6 hours PRN    Note to Pharmacy:  DOSE CHANGE/DECREASE   01/30/18 0351    apixaban (ELIQUIS) 5 MG TABS tablet  2 times daily     01/30/18 0351         Plan discharge  Devoria Albe, MD, Concha Pyo, MD 01/30/18 [phone]

## 2018-01-30 NOTE — Discharge Instructions (Addendum)
The cardiologist on call today wants you to start your Cardizem today and tomorrow and take it 4 times a day unless your heart rate is below 60 and then do not take it.  After that you can take it as needed for heart rate over 100.  Please start the Eliquis to help prevent stroke.  Call Dr. Ival Bible office today to let them know about your ED visit, they may want to see you sooner than your appointment on the 11th.  Return to the ED however if you feel worse or you cannot get your heart rate controlled again.

## 2018-01-30 NOTE — ED Triage Notes (Signed)
Pt C/O chest discomfort that started around 2100 last night. Pt also states that he felt his heart "fluttering." Pt states that he has taken 1 Cardizem 30mg  around 2300.

## 2018-02-06 NOTE — Progress Notes (Signed)
Cardiology Office Note  Date: 02/09/2018   ID: [name], DOB [birth year], MRN [id number]  PCP: Benita Stabile, MD  Primary Cardiologist: Nona Dell, MD   Chief Complaint  Patient presents with  . Atrial Flutter    History of Present Illness: Wesley [name] is a 57 y.o. male last seen in September 2018.  Interval records reviewed finding recent ER visit in early November with symptomatic, persistent atrial flutter.  He has a history of paroxysmal atrial fibrillation with good rhythm control on flecainide over time.  He was seen by Dr. Lynelle Doctor and given IV Lopressor for heart rate control.  Case discussed with cardiologist on call with recommendation to initiate Eliquis, he had previously been on aspirin with CHADSVASC score of 1.  He was also instructed to take short acting Cardizem on a regular basis pending follow-up with me.  He presents today stating that the day after he was seen in the ER his heart rate went back to normal and he has had no palpitations since then.  He has not been using short acting Cardizem, but continues on flecainide.  He did not initiate the Eliquis and wanted to talk about that today.  His CHADSVASC score remains 1 (IIb indication).  I did talk to him about Eliquis instead of aspirin, and for now he is in agreement.  I personally reviewed his ECG today which shows sinus rhythm with Q's in lead III and aVF, normal QRS duration.  Related to the above symptoms he also states that over the last 6 months or so he has had intermittent episodes of fatigue with exertion, sometimes a feeling of discomfort in his chest.  He has not undergone recent interval ischemic testing.  Past Medical History:  Diagnosis Date  . Atrial fibrillation (HCC)    CHADSVASC score 1  . Esophageal stricture    Multiple dilations  . GERD (gastroesophageal reflux disease)   . Hyperlipidemia   . Hypertension     Past Surgical History:  Procedure Laterality Date  . Lumbar  luminectomy  2007   Dr. Coletta Memos  . Partial finger amputation      Current Outpatient Medications  Medication Sig Dispense Refill  . diltiazem (CARDIZEM) 30 MG tablet Take 1 tablet (30 mg total) by mouth every 6 (six) hours as needed. PALPITATIONS 60 tablet 0  . ezetimibe (ZETIA) 10 MG tablet Take 10 mg by mouth daily.  5  . flecainide (TAMBOCOR) 100 MG tablet TAKE 1 TABLET BY MOUTH TWICE A DAY (Patient taking differently: Takes an extra 50mg  as needed.) 180 tablet 3  . nebivolol (BYSTOLIC) 10 MG tablet Take 10 mg by mouth daily.      . pantoprazole (PROTONIX) 40 MG tablet Take 40 mg by mouth daily.    Marland Kitchen apixaban (ELIQUIS) 5 MG TABS tablet Take 1 tablet (5 mg total) by mouth 2 (two) times daily. 180 tablet 1   No current facility-administered medications for this visit.    Allergies:  Patient has no known allergies.   Social History: The patient  reports that he quit smoking about 9 years ago. His smoking use included cigarettes. He has a 7.50 pack-year smoking history. His smokeless tobacco use includes snuff and chew. He reports that he does not drink alcohol or use drugs.   ROS:  Please see the history of present illness. Otherwise, complete review of systems is positive for none.  All other systems are reviewed and negative.   Physical Exam:  VS:  BP 120/78   Pulse 63   Ht 5\' 11"  (1.803 m)   Wt 201 lb (91.2 kg)   SpO2 98%   BMI 28.03 kg/m , BMI Body mass index is 28.03 kg/m.  Wt Readings from Last 3 Encounters:  02/09/18 201 lb (91.2 kg)  01/30/18 199 lb 8.3 oz (90.5 kg)  12/09/16 199 lb 9.6 oz (90.5 kg)    General: Patient appears comfortable at rest. HEENT: Conjunctiva and lids normal, oropharynx clear. Neck: Supple, no elevated JVP or carotid bruits, no thyromegaly. Lungs: Clear to auscultation, nonlabored breathing at rest. Cardiac: Regular rate and rhythm, no S3, soft systolic murmur. Abdomen: Soft, nontender, bowel sounds present. Extremities: No pitting edema,  distal pulses 2+. Skin: Warm and dry. Musculoskeletal: No kyphosis. Neuropsychiatric: Alert and oriented x3, affect grossly appropriate.  ECG: I personally reviewed the tracing from 01/30/2018 which showed probable atrial flutter with variable conduction.  Recent Labwork: 01/30/2018: BUN 12; Creatinine, Ser 1.35; Hemoglobin 15.3; Platelets 238; Potassium 3.3; Sodium 140   Other Studies Reviewed Today:  Echocardiogram 05/10/2010 [facility]): Moderate basal septal LV hypertrophy, LVEF 55-60%, mild aortic regurgitation.  Exercise Myoview 05/10/2010 [facility]): No diagnostic ST segment changes at 12.8 METS. No chest pain. No ischemic perfusion defects with LVEF 59%.  Assessment and Plan:  1.  Paroxysmal atrial fibrillation and recent atrial flutter.  CHADSVASC score is 1.  He has had good rhythm control overall on flecainide.  After discussion today plan will be to go ahead with Eliquis, stop aspirin, otherwise continue flecainide and Bystolic with as needed use of short acting Cardizem.  Follow-up CBC and BMET for next visit.  2.  Essential hypertension, blood pressure is normal today on Bystolic.  He continues to follow with Dr. Margo Aye.  3.  Exertional fatigue with intermittent chest pain.  He has not undergone recent ischemic testing, previously reassuring as of 2012.  He does have family history of CAD.  Plan is to proceed with a Lexiscan Myoview for repeat evaluation.  4.  History of mild aortic regurgitation.  We will also follow-up with an echocardiogram since he has not undergone testing since 2012.  Current medicines were reviewed with the patient today.   Orders Placed This Encounter  Procedures  . NM Myocar Multi W/Spect W/Wall Motion / EF  . CBC  . Basic Metabolic Panel (BMET)  . EKG 12-Lead  . ECHOCARDIOGRAM COMPLETE    Disposition: Follow-up in 3 months.  Signed, Jonelle Sidle, MD, [facility] 02/09/2018 4:04 PM    [facility] at [facility] [address] [place], [place], [place] [address] Phone: [phone]; Fax: [phone]

## 2018-02-09 ENCOUNTER — Telehealth: Payer: Self-pay | Admitting: Cardiology

## 2018-02-09 ENCOUNTER — Encounter: Payer: Self-pay | Admitting: *Deleted

## 2018-02-09 ENCOUNTER — Ambulatory Visit: Payer: BLUE CROSS/BLUE SHIELD | Admitting: Cardiology

## 2018-02-09 ENCOUNTER — Encounter: Payer: Self-pay | Admitting: Cardiology

## 2018-02-09 VITALS — BP 120/78 | HR 63 | Ht 71.0 in | Wt 201.0 lb

## 2018-02-09 DIAGNOSIS — I351 Nonrheumatic aortic (valve) insufficiency: Secondary | ICD-10-CM | POA: Diagnosis not present

## 2018-02-09 DIAGNOSIS — R072 Precordial pain: Secondary | ICD-10-CM

## 2018-02-09 DIAGNOSIS — I480 Paroxysmal atrial fibrillation: Secondary | ICD-10-CM | POA: Diagnosis not present

## 2018-02-09 DIAGNOSIS — I10 Essential (primary) hypertension: Secondary | ICD-10-CM | POA: Diagnosis not present

## 2018-02-09 MED ORDER — APIXABAN 5 MG PO TABS: 5.0000 mg | ORAL_TABLET | Freq: Two times a day (BID) | ORAL | 1 refills | Status: DC

## 2018-02-09 NOTE — Telephone Encounter (Signed)
°  Precert needed for: Lexiscan   Location: [facility]    Date: Mar 02, 2018

## 2018-02-09 NOTE — Patient Instructions (Signed)
Your physician recommends that you schedule a follow-up appointment in: 3 MONTHS WITH DR MCDOWELL  Your physician has recommended you make the following change in your medication:   STOP ASPIRIN   START ELIQUIS 5 MG TWICE DAILY   Your physician recommends that you return for lab work in: 3 MONTHS JUST BEFORE YOUR NEXT APPOINTMENT BMP/CBC  Your physician has requested that you have an echocardiogram. Echocardiography is a painless test that uses sound waves to create images of your heart. It provides your doctor with information about the size and shape of your heart and how well your heart's chambers and valves are working. This procedure takes approximately one hour. There are no restrictions for this procedure.  Your physician has requested that you have a lexiscan myoview. For further information please visit [email or web address] Please follow instruction sheet, as given.  Thank you for choosing [facility]!!

## 2018-02-09 NOTE — Telephone Encounter (Signed)
°  Precert needed for: Echo   Location: [facility]     Date: Feb 19, 2018

## 2018-02-19 ENCOUNTER — Other Ambulatory Visit: Payer: Self-pay

## 2018-02-19 ENCOUNTER — Ambulatory Visit (INDEPENDENT_AMBULATORY_CARE_PROVIDER_SITE_OTHER): Payer: BLUE CROSS/BLUE SHIELD

## 2018-02-19 DIAGNOSIS — R072 Precordial pain: Secondary | ICD-10-CM | POA: Diagnosis not present

## 2018-02-20 ENCOUNTER — Telehealth: Payer: Self-pay | Admitting: *Deleted

## 2018-02-20 NOTE — Telephone Encounter (Signed)
-----   Message from Jonelle SidleSamuel G McDowell, MD sent at 02/20/2018 12:47 PM EST ----- Results reviewed.  LVEF remains normal range of 55 to 60%.  Mild aortic regurgitation as before, not of major clinical significance at this point. A copy of this test should be forwarded to Benita StabileHall, John Z, MD.

## 2018-02-20 NOTE — Telephone Encounter (Signed)
Patient informed. Copy sent to PCP °

## 2018-03-02 ENCOUNTER — Encounter (HOSPITAL_BASED_OUTPATIENT_CLINIC_OR_DEPARTMENT_OTHER)
Admission: RE | Admit: 2018-03-02 | Discharge: 2018-03-02 | Disposition: A | Discharge status: Home / Self Care | Payer: BLUE CROSS/BLUE SHIELD | Source: Ambulatory Visit | Attending: Cardiology | Admitting: Cardiology

## 2018-03-02 ENCOUNTER — Ambulatory Visit (HOSPITAL_COMMUNITY)
Admission: RE | Admit: 2018-03-02 | Discharge: 2018-03-02 | Disposition: A | Discharge status: Home / Self Care | Payer: BLUE CROSS/BLUE SHIELD | Source: Ambulatory Visit | Attending: Cardiology | Admitting: Cardiology

## 2018-03-02 ENCOUNTER — Encounter (HOSPITAL_COMMUNITY): Payer: Self-pay

## 2018-03-02 DIAGNOSIS — R072 Precordial pain: Secondary | ICD-10-CM

## 2018-03-02 LAB — NM MYOCAR MULTI W/SPECT W/WALL MOTION / EF
CHL CUP NUCLEAR SSS: 2
LHR: 0.31
LVDIAVOL: 119 mL (ref 62–150)
LVSYSVOL: 44 mL
NUC STRESS TID: 1.06
Peak HR: 77 {beats}/min
Rest HR: 58 {beats}/min
SDS: 0
SRS: 2

## 2018-03-02 MED ORDER — REGADENOSON 0.4 MG/5ML IV SOLN
INTRAVENOUS | Status: AC
  Administered 2018-03-02: 0.4 mg via INTRAVENOUS
  Filled 2018-03-02: qty 5

## 2018-03-02 MED ORDER — TECHNETIUM TC 99M TETROFOSMIN IV KIT
10.0000 | PACK | Freq: Once | INTRAVENOUS | Status: AC | PRN
  Administered 2018-03-02: 9.8 via INTRAVENOUS

## 2018-03-02 MED ORDER — TECHNETIUM TC 99M TETROFOSMIN IV KIT
30.0000 | PACK | Freq: Once | INTRAVENOUS | Status: AC | PRN
  Administered 2018-03-02: 30.1 via INTRAVENOUS

## 2018-03-02 MED ORDER — SODIUM CHLORIDE 0.9% FLUSH
INTRAVENOUS | Status: AC
  Administered 2018-03-02: 10 mL via INTRAVENOUS
  Filled 2018-03-02: qty 10

## 2018-03-03 ENCOUNTER — Telehealth: Payer: Self-pay | Admitting: *Deleted

## 2018-03-03 NOTE — Telephone Encounter (Signed)
Patient informed. Copy sent to PCP °

## 2018-03-03 NOTE — Telephone Encounter (Signed)
-----   Message from Jonelle SidleSamuel G McDowell, MD sent at 03/02/2018 12:55 PM EST ----- Results reviewed.  Overall low risk stress test arguing against significant obstructive CAD.  Continue with current plan and follow-up. A copy of this test should be forwarded to Benita StabileHall, John Z, MD.

## 2018-05-06 DIAGNOSIS — J09X2 Influenza due to identified novel influenza A virus with other respiratory manifestations: Secondary | ICD-10-CM | POA: Diagnosis not present

## 2018-05-15 ENCOUNTER — Ambulatory Visit: Payer: BLUE CROSS/BLUE SHIELD | Admitting: Cardiology

## 2018-06-08 ENCOUNTER — Other Ambulatory Visit (HOSPITAL_COMMUNITY)
Admission: RE | Admit: 2018-06-08 | Discharge: 2018-06-08 | Disposition: A | Discharge status: Home / Self Care | Payer: BLUE CROSS/BLUE SHIELD | Source: Ambulatory Visit | Attending: Cardiology | Admitting: Cardiology

## 2018-06-08 DIAGNOSIS — I480 Paroxysmal atrial fibrillation: Secondary | ICD-10-CM | POA: Diagnosis not present

## 2018-06-08 LAB — BASIC METABOLIC PANEL
Anion gap: 8 (ref 5–15)
BUN: 15 mg/dL (ref 6–20)
CHLORIDE: 105 mmol/L (ref 98–111)
CO2: 26 mmol/L (ref 22–32)
CREATININE: 1.32 mg/dL — AB (ref 0.61–1.24)
Calcium: 9.1 mg/dL (ref 8.9–10.3)
GFR calc Af Amer: >60 mL/min (ref >60)
GFR calc non Af Amer: 59 mL/min — ABNORMAL LOW (ref >60)
GLUCOSE: 110 mg/dL — AB (ref 70–99)
Potassium: 3.8 mmol/L (ref 3.5–5.1)
SODIUM: 139 mmol/L (ref 135–145)

## 2018-06-08 LAB — CBC
HCT: 43.2 % (ref 39.0–52.0)
Hemoglobin: 13.5 g/dL (ref 13.0–17.0)
MCH: 30 pg (ref 26.0–34.0)
MCHC: 31.3 g/dL (ref 30.0–36.0)
MCV: 96 fL (ref 80.0–100.0)
Platelets: 223 10*3/uL (ref 150–400)
RBC: 4.5 MIL/uL (ref 4.22–5.81)
RDW: 12.5 % (ref 11.5–15.5)
WBC: 7.8 10*3/uL (ref 4.0–10.5)
nRBC: 0 % (ref 0.0–0.2)

## 2018-06-09 ENCOUNTER — Encounter: Payer: Self-pay | Admitting: Cardiology

## 2018-06-09 ENCOUNTER — Ambulatory Visit: Payer: BLUE CROSS/BLUE SHIELD | Admitting: Cardiology

## 2018-06-09 VITALS — BP 128/72 | HR 58 | Ht 71.0 in | Wt 202.0 lb

## 2018-06-09 DIAGNOSIS — I351 Nonrheumatic aortic (valve) insufficiency: Secondary | ICD-10-CM

## 2018-06-09 DIAGNOSIS — I10 Essential (primary) hypertension: Secondary | ICD-10-CM

## 2018-06-09 DIAGNOSIS — I480 Paroxysmal atrial fibrillation: Secondary | ICD-10-CM | POA: Diagnosis not present

## 2018-06-09 NOTE — Patient Instructions (Addendum)
Medication Instructions:   Your physician recommends that you continue on your current medications as directed. Please refer to the Current Medication list given to you today.  Labwork:  Your physician recommends that you return for lab work in: 6 months just before your next visit to check your BMET & CBC. We will notify you when this is due.  Testing/Procedures:  NONE  Follow-Up:  Your physician recommends that you schedule a follow-up appointment in: 6 months. You will receive a reminder letter in the mail in about 4 months reminding you to call and schedule your appointment. If you don't receive this letter, please contact our office.  Any Other Special Instructions Will Be Listed Below (If Applicable).  If you need a refill on your cardiac medications before your next appointment, please call your pharmacy. 

## 2018-06-09 NOTE — Progress Notes (Signed)
Cardiology Office Note  Date: 06/09/2018   ID: KUNTE CARDA, DOB 1960/10/11, MRN [id number]  PCP: Benita Stabile, MD  Primary Cardiologist: Nona Dell, MD   Chief Complaint  Patient presents with  . Atrial Fibrillation    History of Present Illness: Wesley [name] is a 58 y.o. male last seen in November 2019.  He is here for a routine visit.  He reports no progressive palpitations since last encounter.  Follow-up ischemic testing from December 2019 was low risk as outlined below.  LVEF also normal range.  We have continued him on flecainide for rhythm suppression.  He has not had to use any additional short acting Cardizem tablets.  Also tolerating Eliquis without bleeding problems.  I reviewed his recent lab work as outlined below.  Past Medical History:  Diagnosis Date  . Atrial fibrillation (HCC)    CHADSVASC score 1  . Esophageal stricture    Multiple dilations  . GERD (gastroesophageal reflux disease)   . Hyperlipidemia   . Hypertension     Past Surgical History:  Procedure Laterality Date  . Lumbar luminectomy  2007   Dr. Coletta Memos  . Partial finger amputation      Current Outpatient Medications  Medication Sig Dispense Refill  . apixaban (ELIQUIS) 5 MG TABS tablet Take 1 tablet (5 mg total) by mouth 2 (two) times daily. 180 tablet 1  . diltiazem (CARDIZEM) 30 MG tablet Take 1 tablet (30 mg total) by mouth every 6 (six) hours as needed. PALPITATIONS 60 tablet 0  . ezetimibe (ZETIA) 10 MG tablet Take 10 mg by mouth daily.  5  . flecainide (TAMBOCOR) 100 MG tablet TAKE 1 TABLET BY MOUTH TWICE A DAY (Patient taking differently: Takes an extra 50mg  as needed.) 180 tablet 3  . nebivolol (BYSTOLIC) 10 MG tablet Take 10 mg by mouth daily.      . pantoprazole (PROTONIX) 40 MG tablet Take 40 mg by mouth daily.     No current facility-administered medications for this visit.    Allergies:  Patient has no known allergies.   Social History: The patient  reports that  he quit smoking about 9 years ago. His smoking use included cigarettes. He has a 7.50 pack-year smoking history. His smokeless tobacco use includes snuff and chew. He reports that he does not drink alcohol or use drugs.   ROS:  Please see the history of present illness. Otherwise, complete review of systems is positive for none.  All other systems are reviewed and negative.   Physical Exam: VS:  BP 128/72   Pulse (!) 58   Ht 5\' 11"  (1.803 m)   Wt 202 lb (91.6 kg)   SpO2 98%   BMI 28.17 kg/m , BMI Body mass index is 28.17 kg/m.  Wt Readings from Last 3 Encounters:  06/09/18 202 lb (91.6 kg)  02/09/18 201 lb (91.2 kg)  01/30/18 199 lb 8.3 oz (90.5 kg)    General: Patient appears comfortable at rest. HEENT: Conjunctiva and lids normal, oropharynx clear. Neck: Supple, no elevated JVP or carotid bruits, no thyromegaly. Lungs: Clear to auscultation, nonlabored breathing at rest. Cardiac: Regular rate and rhythm, no S3, soft systolic murmur. Abdomen: Soft, nontender, bowel sounds present. Extremities: No pitting edema, distal pulses 2+. Skin: Warm and dry. Musculoskeletal: No kyphosis. Neuropsychiatric: Alert and oriented x3, affect grossly appropriate.  ECG: I personally reviewed the tracing from 02/09/2018 which showed sinus rhythm with borderline increased voltage, Q in lead III and  aVF, nonspecific T wave changes.  Recent Labwork: 06/08/2018: BUN 15; Creatinine, Ser 1.32; Hemoglobin 13.5; Platelets 223; Potassium 3.8; Sodium 139   Other Studies Reviewed Today:  Lexiscan Myoview 03/02/2018:  No diagnostic ST segment changes to indicate ischemia.  No significant myocardial perfusion defects to indicate scar or ischemia in the setting of radiotracer uptake within the gut near the inferior wall.  This is a low risk study.  Nuclear stress EF: 64%.  Echocardiogram 02/19/2018: Study Conclusions  - Left ventricle: The cavity size was normal. Wall thickness was   normal. Systolic  function was normal. The estimated ejection   fraction was in the range of 55% to 60%. Wall motion was normal;   there were no regional wall motion abnormalities. Left   ventricular diastolic function parameters were normal. - Aortic valve: Mildly calcified annulus. Trileaflet. There was   mild regurgitation. - Mitral valve: Mildly calcified annulus. There was trivial   regurgitation. - Right atrium: Central venous pressure (est): 3 mm Hg. - Atrial septum: No defect or patent foramen ovale was identified. - Tricuspid valve: There was trivial regurgitation. - Pulmonary arteries: PA peak pressure: 14 mm Hg (S). - Pericardium, extracardiac: There was no pericardial effusion.  Assessment and Plan:  1.  Paroxysmal atrial fibrillation.  CHADSVASC score is 1.  We have elected to continue anticoagulation with Eliquis.  He is otherwise on Bystolic at baseline and has short acting Cardizem to use for breakthrough palpitations.  Recent lab work reviewed and stable.  No bleeding episodes.  Interval follow-up ischemic testing was reassuring and LVEF remains normal.  2.  Essential hypertension, blood pressure control is adequate today.  3.  Mild aortic regurgitation, asymptomatic.  Current medicines were reviewed with the patient today.  Disposition: Follow-up CBC and BMET with next visit in 6 months.  Signed, Jonelle Sidle, MD, [facility] 06/09/2018 4:26 PM    [facility] at [facility] [address] [place], [place], [place] [address] Phone: [phone]; Fax: [phone]

## 2018-08-23 ENCOUNTER — Other Ambulatory Visit: Payer: Self-pay | Admitting: Cardiology

## 2018-10-01 ENCOUNTER — Other Ambulatory Visit: Payer: Self-pay | Admitting: *Deleted

## 2018-10-01 MED ORDER — FLECAINIDE ACETATE 100 MG PO TABS: 100.0000 mg | ORAL_TABLET | Freq: Two times a day (BID) | ORAL | 1 refills | Status: DC

## 2018-10-05 ENCOUNTER — Other Ambulatory Visit: Payer: Self-pay | Admitting: *Deleted

## 2018-10-05 MED ORDER — FLECAINIDE ACETATE 100 MG PO TABS: 100.0000 mg | ORAL_TABLET | Freq: Two times a day (BID) | ORAL | 1 refills | Status: DC

## 2018-12-03 DIAGNOSIS — N529 Male erectile dysfunction, unspecified: Secondary | ICD-10-CM | POA: Diagnosis not present

## 2018-12-03 DIAGNOSIS — R7301 Impaired fasting glucose: Secondary | ICD-10-CM | POA: Diagnosis not present

## 2018-12-03 DIAGNOSIS — E782 Mixed hyperlipidemia: Secondary | ICD-10-CM | POA: Diagnosis not present

## 2018-12-03 DIAGNOSIS — R7303 Prediabetes: Secondary | ICD-10-CM | POA: Diagnosis not present

## 2018-12-03 DIAGNOSIS — E291 Testicular hypofunction: Secondary | ICD-10-CM | POA: Diagnosis not present

## 2018-12-03 DIAGNOSIS — I10 Essential (primary) hypertension: Secondary | ICD-10-CM | POA: Diagnosis not present

## 2018-12-21 DIAGNOSIS — R7303 Prediabetes: Secondary | ICD-10-CM | POA: Diagnosis not present

## 2018-12-21 DIAGNOSIS — Z0001 Encounter for general adult medical examination with abnormal findings: Secondary | ICD-10-CM | POA: Diagnosis not present

## 2018-12-21 DIAGNOSIS — I480 Paroxysmal atrial fibrillation: Secondary | ICD-10-CM | POA: Diagnosis not present

## 2018-12-21 DIAGNOSIS — K219 Gastro-esophageal reflux disease without esophagitis: Secondary | ICD-10-CM | POA: Diagnosis not present

## 2018-12-31 ENCOUNTER — Telehealth: Payer: Self-pay | Admitting: Cardiology

## 2018-12-31 NOTE — Telephone Encounter (Signed)
Virtual Visit Pre-Appointment Phone Call  "(Name), I am calling you today to discuss your upcoming appointment. We are currently trying to limit exposure to the virus that causes COVID-19 by seeing patients at home rather than in the office."  1. "What is the BEST phone number to call the day of the visit?" - include this in appointment notes  2. Do you have or have access to (through a family member/friend) a smartphone with video capability that we can use for your visit?" a. If yes - list this number in appt notes as cell (if different from BEST phone #) and list the appointment type as a VIDEO visit in appointment notes b. If no - list the appointment type as a PHONE visit in appointment notes  Confirm consent - "In the setting of the current Covid19 crisis, you are scheduled for a (phone or video) visit with your provider on (date) at (time).  Just as we do with many in-office visits, in order for you to participate in this visit, we must obtain consent.  If you'd like, I can send this to your mychart (if signed up) or email for you to review.  Otherwise, I can obtain your verbal consent now.  All virtual visits are billed to your insurance company just like a normal visit would be.  By agreeing to a virtual visit, we'd like you to understand that the technology does not allow for your provider to perform an examination, and thus may limit your provider's ability to fully assess your condition. If your provider identifies any concerns that need to be evaluated in person, we will make arrangements to do so.  Finally, though the technology is pretty good, we cannot assure that it will always work on either your or our end, and in the setting of a video visit, we may have to convert it to a phone-only visit.  In either situation, we cannot ensure that we have a secure connection.  Are you willing to proceed?" STAFF: Did the patient verbally acknowledge consent to telehealth visit? Document  YES/NO here: yes 3. Advise patient to be prepared - "Two hours prior to your appointment, go ahead and check your blood pressure, pulse, oxygen saturation, and your weight (if you have the equipment to check those) and write them all down. When your visit starts, your provider will ask you for this information. If you have an Apple Watch or Kardia device, please plan to have heart rate information ready on the day of your appointment. Please have a pen and paper handy nearby the day of the visit as well."  4. Give patient instructions for MyChart download to smartphone OR Doximity/Doxy.me as below if video visit (depending on what platform provider is using)  5. Inform patient they will receive a phone call 15 minutes prior to their appointment time (may be from unknown caller ID) so they should be prepared to answer    TELEPHONE CALL NOTE  Wesley [name] has been deemed a candidate for a follow-up tele-health visit to limit community exposure during the Covid-19 pandemic. I spoke with the patient via phone to ensure availability of phone/video source, confirm preferred email & phone number, and discuss instructions and expectations.  I reminded [name] to be prepared with any vital sign and/or heart rhythm information that could potentially be obtained via home monitoring, at the time of his visit. I reminded [name] to expect a phone call prior to his visit.  Weston Anna 12/31/2018 4:10 PM   INSTRUCTIONS FOR DOWNLOADING THE MYCHART APP TO SMARTPHONE  - The patient must first make sure to have activated MyChart and know their login information - If Apple, go to [employer] and type in MyChart in the search bar and download the app. If Android, ask patient to go to [employer] and type in [place] in the search bar and download the app. The app is free but as with any other app downloads, their phone may require them to verify saved payment information or Apple/Android password.    - The patient will need to then log into the app with their MyChart username and password, and select [facility] as their healthcare provider to link the account. When it is time for your visit, go to the MyChart app, find appointments, and click Begin Video Visit. Be sure to Select Allow for your device to access the Microphone and Camera for your visit. You will then be connected, and your provider will be with you shortly.  **If they have any issues connecting, or need assistance please contact MyChart service desk [phone])**  **If using a computer, in order to ensure the best quality for their visit they will need to use either of the following Internet Browsers: [employer], or Google Chrome**  IF USING DOXIMITY or DOXY.ME - The patient will receive a link just prior to their visit by text.     FULL LENGTH CONSENT FOR TELE-HEALTH VISIT   I hereby voluntarily request, consent and authorize [facility] and its employed or contracted physicians, physician assistants, nurse practitioners or other licensed health care professionals (the Practitioner), to provide me with telemedicine health care services (the Services") as deemed necessary by the treating Practitioner. I acknowledge and consent to receive the Services by the Practitioner via telemedicine. I understand that the telemedicine visit will involve communicating with the Practitioner through live audiovisual communication technology and the disclosure of certain medical information by electronic transmission. I acknowledge that I have been given the opportunity to request an in-person assessment or other available alternative prior to the telemedicine visit and am voluntarily participating in the telemedicine visit.  I understand that I have the right to withhold or withdraw my consent to the use of telemedicine in the course of my care at any time, without affecting my right to future care or treatment, and that  the Practitioner or I may terminate the telemedicine visit at any time. I understand that I have the right to inspect all information obtained and/or recorded in the course of the telemedicine visit and may receive copies of available information for a reasonable fee.  I understand that some of the potential risks of receiving the Services via telemedicine include:   Delay or interruption in medical evaluation due to technological equipment failure or disruption;  Information transmitted may not be sufficient (e.g. poor resolution of images) to allow for appropriate medical decision making by the Practitioner; and/or   In rare instances, security protocols could fail, causing a breach of personal health information.  Furthermore, I acknowledge that it is my responsibility to provide information about my medical history, conditions and care that is complete and accurate to the best of my ability. I acknowledge that Practitioner's advice, recommendations, and/or decision may be based on factors not within their control, such as incomplete or inaccurate data provided by me or distortions of diagnostic images or specimens that may result from electronic transmissions. I understand that  the practice of medicine is not an exact science and that Practitioner makes no warranties or guarantees regarding treatment outcomes. I acknowledge that I will receive a copy of this consent concurrently upon execution via email to the email address I last provided but may also request a printed copy by calling the office of [facility].    I understand that my insurance will be billed for this visit.   I have read or had this consent read to me.  I understand the contents of this consent, which adequately explains the benefits and risks of the Services being provided via telemedicine.   I have been provided ample opportunity to ask questions regarding this consent and the Services and have had my questions answered to  my satisfaction.  I give my informed consent for the services to be provided through the use of telemedicine in my medical care  By participating in this telemedicine visit I agree to the above.

## 2019-01-06 NOTE — Progress Notes (Signed)
Virtual Visit via Telephone Note   This visit type was conducted due to national recommendations for restrictions regarding the COVID-19 Pandemic (e.g. social distancing) in an effort to limit this patient's exposure and mitigate transmission in our community.  Due to his co-morbid illnesses, this patient is at least at moderate risk for complications without adequate follow up.  This format is felt to be most appropriate for this patient at this time.  The patient did not have access to video technology/had technical difficulties with video requiring transitioning to audio format only (telephone).  All issues noted in this document were discussed and addressed.  No physical exam could be performed with this format.  Please refer to the patient's chart for his  consent to telehealth for [facility].  Date:  01/07/2019   ID:  Wesley [name], DOB Aug 19, 1960, MRN [id number]  Patient Location: Home Provider Location: Office  PCP:  Celene Squibb, MD  Cardiologist:  Rozann Lesches, MD Electrophysiologist:  None   Evaluation Performed:  Follow-Up Visit  Chief Complaint:   Cardiac follow-up  History of Present Illness:    CLEMENT DENEAULT is a 58 y.o. male last seen in March.  We spoke by phone today.  He reports brief palpitations consistent with his atrial fibrillation.  He he had another episode that was somewhat more prolonged one morning after waking up, states that it felt somewhat different, but has not recurred since.  He does not describe any exertional chest pain or syncope.  I reviewed his recent lab work as outlined below.  He does not report any bleeding problems on Eliquis.  We went over his medications, he has been tolerating flecainide, Bystolic, and as needed diltiazem (which he has not used very much).  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  The patient does not have symptoms concerning for COVID-19 infection (fever, chills, cough, or new shortness of breath).    Past  Medical History:  Diagnosis Date  . Atrial fibrillation (HCC)    CHADSVASC score 1  . Esophageal stricture    Multiple dilations  . GERD (gastroesophageal reflux disease)   . Hyperlipidemia   . Hypertension    Past Surgical History:  Procedure Laterality Date  . Lumbar luminectomy  2007   Dr. Ashok Pall  . Partial finger amputation       Current Meds  Medication Sig  . diltiazem (CARDIZEM) 30 MG tablet Take 1 tablet (30 mg total) by mouth every 6 (six) hours as needed. PALPITATIONS  . ELIQUIS 5 MG TABS tablet TAKE 1 TABLET BY MOUTH TWICE A DAY  . flecainide (TAMBOCOR) 100 MG tablet Take 1 tablet (100 mg total) by mouth 2 (two) times daily.  . nebivolol (BYSTOLIC) 10 MG tablet Take 10 mg by mouth daily.    Marland Kitchen NEXLIZET 180-10 MG TABS Take 1 tablet by mouth daily.  . pantoprazole (PROTONIX) 40 MG tablet Take 40 mg by mouth daily.     Allergies:   Patient has no known allergies.   Social History   Tobacco Use  . Smoking status: Former Smoker    Packs/day: 0.50    Years: 15.00    Pack years: 7.50    Types: Cigarettes    Quit date: 12/30/2008    Years since quitting: 10.0  . Smokeless tobacco: Current User    Types: Snuff, Chew  . Tobacco comment: dips 2 cans/snuff per week  Substance Use Topics  . Alcohol use: No    Alcohol/week:  0.0 standard drinks  . Drug use: No     Family Hx: The patient's family history includes Aortic stenosis in his brother; Heart attack in his brother; Heart failure in his father.  ROS:   Please see the history of present illness. All other systems reviewed and are negative.   Prior CV studies:   The following studies were reviewed today:  Lexiscan Myoview 03/02/2018:  No diagnostic ST segment changes to indicate ischemia.  No significant myocardial perfusion defects to indicate scar or ischemia in the setting of radiotracer uptake within the gut near the inferior wall.  This is a low risk study.  Nuclear stress EF: 64%.   Echocardiogram 02/19/2018: Study Conclusions  - Left ventricle: The cavity size was normal. Wall thickness was normal. Systolic function was normal. The estimated ejection fraction was in the range of 55% to 60%. Wall motion was normal; there were no regional wall motion abnormalities. Left ventricular diastolic function parameters were normal. - Aortic valve: Mildly calcified annulus. Trileaflet. There was mild regurgitation. - Mitral valve: Mildly calcified annulus. There was trivial regurgitation. - Right atrium: Central venous pressure (est): 3 mm Hg. - Atrial septum: No defect or patent foramen ovale was identified. - Tricuspid valve: There was trivial regurgitation. - Pulmonary arteries: PA peak pressure: 14 mm Hg (S). - Pericardium, extracardiac: There was no pericardial effusion.  Labs/Other Tests and Data Reviewed:    EKG:  An ECG dated 02/09/2018 was personally reviewed today and demonstrated:  Sinus rhythm with borderline increased voltage, Q in lead III and aVF, nonspecific T wave changes.  Recent Labs: 06/08/2018: BUN 15; Creatinine, Ser 1.32; Hemoglobin 13.5; Platelets 223; Potassium 3.8; Sodium 139  September 2020: Hemoglobin 14.3, platelets 207, BUN 15, creatinine 1.27, potassium 4.7, AST 18, ALT 19, cholesterol 205, triglycerides 169 42, LDL 133, hemoglobin A1c 5.6%  Wt Readings from Last 3 Encounters:  01/07/19 202 lb (91.6 kg)  06/09/18 202 lb (91.6 kg)  02/09/18 201 lb (91.2 kg)     Objective:    Vital Signs:  BP 130/86   Ht 5\' 11"  (1.803 m)   Wt 202 lb (91.6 kg)   BMI 28.17 kg/m    Patient spoke in full sentences, not short of breath. No audible wheezing or coughing. Speech pattern normal.  ASSESSMENT & PLAN:    1.  Paroxysmal atrial fibrillation with CHADSVASC score of 1.  We have continued anticoagulation given recurring arrhythmia, although he seems to be tolerating it well on flecainide and Cardizem CD.  He does not report any  bleeding problems, I reviewed his lab work done in September.  I [facility].  Otherwise office visit in 6 months.  2.  Essential hypertension, systolics in the 130s today.  Keep follow-up with Dr. Margo AyeHall.  3.  Mild aortic regurgitation by echocardiogram from last year, asymptomatic.  COVID-19 Education: The signs and symptoms of COVID-19 were discussed with the patient and how to seek care for testing (follow up with PCP or arrange E-visit).  The importance of social distancing was discussed today.  Time:   Today, I have spent 6 minutes with the patient with telehealth technology discussing the above problems.     Medication Adjustments/Labs and Tests Ordered: Current medicines are reviewed at length with the patient today.  Concerns regarding medicines are outlined above.   Tests Ordered: No orders of the defined types were placed in this encounter.   Medication Changes: No orders  of the defined types were placed in this encounter.   Follow Up:  In Person 6 months in the [place] office.  Signed, Nona Dell, MD  01/07/2019 8:58 AM    [facility]

## 2019-01-07 ENCOUNTER — Encounter: Payer: Self-pay | Admitting: Cardiology

## 2019-01-07 ENCOUNTER — Telehealth (INDEPENDENT_AMBULATORY_CARE_PROVIDER_SITE_OTHER): Payer: BC Managed Care – PPO | Admitting: Cardiology

## 2019-01-07 VITALS — BP 130/86 | Ht 71.0 in | Wt 202.0 lb

## 2019-01-07 DIAGNOSIS — I10 Essential (primary) hypertension: Secondary | ICD-10-CM

## 2019-01-07 DIAGNOSIS — I351 Nonrheumatic aortic (valve) insufficiency: Secondary | ICD-10-CM | POA: Diagnosis not present

## 2019-01-07 DIAGNOSIS — I480 Paroxysmal atrial fibrillation: Secondary | ICD-10-CM

## 2019-01-07 NOTE — Patient Instructions (Addendum)
Medication Instructions:   Your physician recommends that you continue on your current medications as directed. Please refer to the Current Medication list given to you today.  Labwork:  NONE  Testing/Procedures:  NONE  Follow-Up:  Your physician recommends that you schedule a follow-up appointment in: 6 months. You will receive a reminder letter in the mail in about 4 months reminding you to call and schedule your appointment. If you don't receive this letter, please contact our office.  Please schedule a nurse visit to get an EKG at your leisure.  Any Other Special Instructions Will Be Listed Below (If Applicable).  If you need a refill on your cardiac medications before your next appointment, please call your pharmacy.

## 2019-01-08 ENCOUNTER — Encounter (INDEPENDENT_AMBULATORY_CARE_PROVIDER_SITE_OTHER): Payer: Self-pay | Admitting: *Deleted

## 2019-03-20 ENCOUNTER — Other Ambulatory Visit: Payer: Self-pay | Admitting: Cardiology

## 2019-04-05 ENCOUNTER — Ambulatory Visit: Payer: BC Managed Care – PPO | Attending: Internal Medicine

## 2019-04-05 ENCOUNTER — Other Ambulatory Visit: Payer: Self-pay

## 2019-04-05 DIAGNOSIS — Z20822 Contact with and (suspected) exposure to covid-19: Secondary | ICD-10-CM

## 2019-04-07 LAB — NOVEL CORONAVIRUS, NAA: SARS-CoV-2, NAA: NOT DETECTED

## 2019-04-30 ENCOUNTER — Other Ambulatory Visit: Payer: Self-pay | Admitting: Cardiology

## 2019-06-26 ENCOUNTER — Emergency Department (HOSPITAL_COMMUNITY): Payer: BC Managed Care – PPO

## 2019-06-26 ENCOUNTER — Emergency Department (HOSPITAL_COMMUNITY)
Admission: EM | Admit: 2019-06-26 | Discharge: 2019-06-26 | Disposition: A | Discharge status: Home / Self Care | Payer: BC Managed Care – PPO | Attending: Emergency Medicine | Admitting: Emergency Medicine

## 2019-06-26 ENCOUNTER — Other Ambulatory Visit: Payer: Self-pay

## 2019-06-26 ENCOUNTER — Encounter (HOSPITAL_COMMUNITY): Payer: Self-pay

## 2019-06-26 DIAGNOSIS — Z7901 Long term (current) use of anticoagulants: Secondary | ICD-10-CM | POA: Diagnosis not present

## 2019-06-26 DIAGNOSIS — R0789 Other chest pain: Secondary | ICD-10-CM | POA: Insufficient documentation

## 2019-06-26 DIAGNOSIS — W2209XA Striking against other stationary object, initial encounter: Secondary | ICD-10-CM | POA: Diagnosis not present

## 2019-06-26 DIAGNOSIS — I10 Essential (primary) hypertension: Secondary | ICD-10-CM | POA: Diagnosis not present

## 2019-06-26 DIAGNOSIS — Z87891 Personal history of nicotine dependence: Secondary | ICD-10-CM | POA: Diagnosis not present

## 2019-06-26 DIAGNOSIS — W19XXXA Unspecified fall, initial encounter: Secondary | ICD-10-CM

## 2019-06-26 DIAGNOSIS — R0602 Shortness of breath: Secondary | ICD-10-CM | POA: Insufficient documentation

## 2019-06-26 DIAGNOSIS — R0781 Pleurodynia: Secondary | ICD-10-CM

## 2019-06-26 DIAGNOSIS — Z79899 Other long term (current) drug therapy: Secondary | ICD-10-CM | POA: Insufficient documentation

## 2019-06-26 DIAGNOSIS — M5489 Other dorsalgia: Secondary | ICD-10-CM | POA: Diagnosis not present

## 2019-06-26 NOTE — ED Provider Notes (Signed)
[facility] EMERGENCY DEPARTMENT Provider Note   CSN: [id number] Arrival date & time: 06/26/19  0759     History Chief Complaint  Patient presents with  . Fall    Wesley [name] is a 59 y.o. male.  HPI 59 year old well-appearing male with a history of A. fib on Eliquis presents to the ER for right lower rib pain since Tuesday after he fell onto his work bench while working on his motorcycle.  Reports most of his pain is under his right breast and his rib cage, but the last few days it has been traveling around to his back.  Reports occasional shortness of breath and pain on inspiration.  He is not taking anything to try to relieve his pain.  He denies chest pain, dizziness, head injury, LOC at the time of the fall or currently.    Past Medical History:  Diagnosis Date  . Atrial fibrillation (HCC)    CHADSVASC score 1  . Esophageal stricture    Multiple dilations  . GERD (gastroesophageal reflux disease)   . Hyperlipidemia   . Hypertension     Patient Active Problem List   Diagnosis Date Noted  . Aortic valve disorders 05/08/2010  . Paroxysmal atrial fibrillation (HCC) 05/08/2010  . HYPERLIPIDEMIA-MIXED 05/07/2010  . HYPERTENSION, BENIGN 05/07/2010  . Palpitations 05/07/2010    Past Surgical History:  Procedure Laterality Date  . Lumbar luminectomy  2007   Dr. Coletta Memos  . Partial finger amputation         Family History  Problem Relation Age of Onset  . Heart failure Father        Died age 32  . Heart attack Brother        Age 33  . Aortic stenosis Brother        Bicuspid valve status post AVR    Social History   Tobacco Use  . Smoking status: Former Smoker    Packs/day: 0.50    Years: 15.00    Pack years: 7.50    Types: Cigarettes    Quit date: 12/30/2008    Years since quitting: 10.4  . Smokeless tobacco: Current User    Types: Snuff, Chew  . Tobacco comment: dips 2 cans/snuff per week  Substance Use Topics  . Alcohol use: No   Alcohol/week: 0.0 standard drinks  . Drug use: No    Home Medications Prior to Admission medications   Medication Sig Start Date End Date Taking? Authorizing Provider  diltiazem (CARDIZEM) 30 MG tablet Take 1 tablet (30 mg total) by mouth every 6 (six) hours as needed. PALPITATIONS 01/30/18   Devoria Albe, MD  ELIQUIS 5 MG TABS tablet TAKE 1 TABLET BY MOUTH TWICE A DAY 03/22/19   Jonelle Sidle, MD  flecainide (TAMBOCOR) 100 MG tablet TAKE 1 TABLET BY MOUTH TWICE A DAY 04/30/19   Jonelle Sidle, MD  nebivolol (BYSTOLIC) 10 MG tablet Take 10 mg by mouth daily.      [provider]  NEXLIZET 180-10 MG TABS Take 1 tablet by mouth daily. 12/23/18   [provider]  pantoprazole (PROTONIX) 40 MG tablet Take 40 mg by mouth daily.    [provider]    Allergies    Patient has no known allergies.  Review of Systems   Review of Systems  Constitutional: Negative for fever.  Respiratory: Positive for shortness of breath. Negative for cough and chest tightness.   Cardiovascular: Negative for chest pain, palpitations and leg swelling.  Gastrointestinal: Negative for abdominal pain, nausea and vomiting.  Musculoskeletal: Positive for back pain. Negative for joint swelling, neck pain and neck stiffness.  Skin: Negative for color change, rash and wound.  Neurological: Negative for dizziness, syncope, weakness, light-headedness, numbness and headaches.  Psychiatric/Behavioral: Negative for confusion.    Physical Exam Updated Vital Signs BP 139/89   Pulse 60   Temp 98 F (36.7 C) (Oral)   Resp 18   Ht 5\' 11"  (1.803 m)   Wt 93 kg   SpO2 97%   BMI 28.59 kg/m   Physical Exam Vitals reviewed.  Constitutional:      General: He is not in acute distress.    Appearance: Normal appearance. He is not ill-appearing.  HENT:     Head: Normocephalic and atraumatic.  Eyes:     Extraocular Movements: Extraocular movements intact.     Pupils: Pupils are equal, round,  and reactive to light.  Cardiovascular:     Rate and Rhythm: Normal rate and regular rhythm.     Pulses: Normal pulses.     Heart sounds: Normal heart sounds.  Pulmonary:     Effort: Pulmonary effort is normal.     Breath sounds: Normal breath sounds. No wheezing or rales.     Comments: Reports pain on inspiration Chest:     Chest wall: Tenderness present.  Abdominal:     General: Abdomen is flat. There is no distension.     Palpations: Abdomen is soft.     Tenderness: There is no abdominal tenderness.  Musculoskeletal:        General: Tenderness present. No swelling, deformity or signs of injury.     Cervical back: Normal range of motion and neck supple.     Comments: Diffuse TTP to the right rib cage under breast.  No appreciated deformity, wound, bruising.  Of note patient has amputated DIP of right middle finger.  Neurological:     General: No focal deficit present.     Mental Status: He is alert and oriented to person, place, and time.  Psychiatric:        Mood and Affect: Mood normal.        Behavior: Behavior normal.     ED Results / Procedures / Treatments   Labs (all labs ordered are listed, but only abnormal results are displayed) Labs Reviewed - No data to display  EKG None  Radiology DG Ribs Unilateral W/Chest Right  Result Date: 06/26/2019 CLINICAL DATA:  Status post fall, mid right rib pain EXAM: RIGHT RIBS AND CHEST - 3+ VIEW COMPARISON:  None. FINDINGS: No fracture or other bone lesions are seen involving the ribs. There is no evidence of pneumothorax or pleural effusion. Both lungs are clear. Heart size and mediastinal contours are within normal limits. IMPRESSION: Negative. Electronically Signed   By: 06/28/2019   On: 06/26/2019 09:31    Procedures Procedures (including critical care time)  Medications Ordered in ED Medications - No data to display  ED Course  I have reviewed the triage vital signs and the nursing notes.  Pertinent labs & imaging  results that were available during my care of the patient were reviewed by me and considered in my medical decision making (see chart for details).    MDM Rules/Calculators/A&P                     59 year old male presents ER with right rib pain after falling on his workbench. On arrival patient  is hypertensive, other vital signs not concerning, patient well-appearing in no acute distress.  Physical exam shows diffuse tenderness to palpation over the right rib cage under his breast with no bruising or deformities.  No abdominal pain; doubt liver injury. Will order right rib series to rule out rib fractures/pneumo.   Rib series negative for pneumothorax or rib fractures.  Discussed results with patients, he will continue to take his over-the-counter ibuprofen and will resume activities as tolerated. I warned the patient that ribs can take a long time to heal. At this point the patient is stable for discharge. Return precautions- sudden increase shortness of breath, chest pain, worsening symptoms.     Final Clinical Impression(s) / ED Diagnoses Final diagnoses:  Fall, initial encounter  Rib pain    Rx / DC Orders ED Discharge Orders    None       Lyndel Safe 06/26/19 [id number]    Noemi Chapel, MD 06/27/19 [phone]

## 2019-06-26 NOTE — ED Provider Notes (Signed)
Medical screening examination/treatment/procedure(s) were conducted as a shared visit with non-physician practitioner(s) and myself.  I personally evaluated the patient during the encounter.  Clinical Impression:   Final diagnoses:  Fall, initial encounter  Rib pain    Pt injured R Ribs against a sharp edge when he was working on his motorcycle, he has pain with deep breathing, pain with movement and palpation, pain when he lays on the right side.  On my exam the patient has a normal-appearing chest wall without crepitance or subcutaneous emphysema, there is no bruising apparent, he does have tenderness over the right anterolateral ribs.  I have personally viewed his x-rays and see no signs of subcutaneous air, there is no obvious rib fractures and no pneumothorax.  This patient has no abdominal tenderness on my exam suggesting there is no liver injury.  Stable for discharge on anti-inflammatories, reassurance given.   Eber Hong, MD 06/27/19 [phone]

## 2019-06-26 NOTE — Discharge Instructions (Addendum)
You were seen in the ER for rib pain.  Your x-rays were negative for rib fractures or collapsed lung.  Continue to take over-the-counter ibuprofen or Tylenol.  Continue activity as tolerated.  Return to the ER if your symptoms worsen.

## 2019-06-26 NOTE — ED Triage Notes (Signed)
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  C/O pain in r ribs.  Pt says has been tolerating it but pain has gotten worse.  Pt says hurts to take a deep breath.

## 2019-07-22 ENCOUNTER — Encounter: Payer: Self-pay | Admitting: Family Medicine

## 2019-07-22 ENCOUNTER — Ambulatory Visit (INDEPENDENT_AMBULATORY_CARE_PROVIDER_SITE_OTHER): Payer: BC Managed Care – PPO | Admitting: Family Medicine

## 2019-07-22 ENCOUNTER — Other Ambulatory Visit: Payer: Self-pay

## 2019-07-22 VITALS — BP 140/80 | HR 60 | Ht 71.0 in | Wt 209.6 lb

## 2019-07-22 DIAGNOSIS — I480 Paroxysmal atrial fibrillation: Secondary | ICD-10-CM

## 2019-07-22 DIAGNOSIS — I10 Essential (primary) hypertension: Secondary | ICD-10-CM | POA: Diagnosis not present

## 2019-07-22 DIAGNOSIS — I351 Nonrheumatic aortic (valve) insufficiency: Secondary | ICD-10-CM

## 2019-07-22 NOTE — Patient Instructions (Signed)

## 2019-07-22 NOTE — Progress Notes (Addendum)
Cardiology Office Note  Date: 07/22/2019   ID: ZAYDRIAN BATTA, DOB [birth year], MRN [id number]  PCP:  Celene Squibb, MD  Cardiologist:  Rozann Lesches, MD Electrophysiologist:  None   Chief Complaint: Follow-up PAF, HTN, HLD  History of Present Illness: Wesley [name] is a 59 y.o. male with a history of PAF, HTN, HLD.  Last encounter 01/07/2019 via telemedicine with Dr. Domenic Polite.  He reported brief palpitations consistent with his atrial fibrillation.  Had one prolonged episode 1 morning when waking up with no recurrence.  Denied any exertional chest pain or syncope.  He was not having any bleeding problems on Eliquis.  He was tolerating flecainide, Bystolic and as needed diltiazem.  Patient states he has been doing well from cardiac standpoint.  Patient states he is only noticing occasional palpitations mostly after the first 12 hours of flecainide and has to redose with the second evening dose he may notice a few palpitations but nothing prolonged or sustained.  He denies being symptomatic when these transient palpitations occur.  He states usually an hour after he takes the second dose of flecainide in the evening the palpitations are alleviated.  He denies any CVA or TIA-like symptoms, orthostatic symptoms, bleeding in stool or urine, claudication-like symptoms, DVT or PE-like symptoms, PND or orthopnea.  He states the CPAP therapy appears to help the palpitations some.  His blood pressure is elevated today on arrival.  States he has had a particularly stressful day.  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  Past Medical History:  Diagnosis Date  . Atrial fibrillation (HCC)    CHADSVASC score 1  . Esophageal stricture    Multiple dilations  . GERD (gastroesophageal reflux disease)   . Hyperlipidemia   . Hypertension     Past Surgical History:  Procedure Laterality Date  . Lumbar luminectomy  2007   Dr. Ashok Pall  . Partial finger amputation      Current Outpatient  Medications  Medication Sig Dispense Refill  . diltiazem (CARDIZEM) 30 MG tablet Take 1 tablet (30 mg total) by mouth every 6 (six) hours as needed. PALPITATIONS 60 tablet 0  . ELIQUIS 5 MG TABS tablet TAKE 1 TABLET BY MOUTH TWICE A DAY 180 tablet 1  . flecainide (TAMBOCOR) 100 MG tablet TAKE 1 TABLET BY MOUTH TWICE A DAY 180 tablet 1  . nebivolol (BYSTOLIC) 10 MG tablet Take 10 mg by mouth daily.      Marland Kitchen NEXLIZET 180-10 MG TABS Take 1 tablet by mouth daily.    Marland Kitchen omeprazole (PRILOSEC) 40 MG capsule Take 40 mg by mouth daily.     No current facility-administered medications for this visit.   Allergies:  Patient has no known allergies.   Social History: The patient  reports that he quit smoking about 10 years ago. His smoking use included cigarettes. He has a 7.50 pack-year smoking history. His smokeless tobacco use includes snuff and chew. He reports that he does not drink alcohol or use drugs.   Family History: The patient's family history includes Aortic stenosis in his brother; Heart attack in his brother; Heart failure in his father.   ROS:  Please see the history of present illness. Otherwise, complete review of systems is positive for none.  All other systems are reviewed and negative.   Physical Exam: VS:  BP 140/80   Pulse 60   Ht 5\' 11"  (1.803 m)   Wt 209 lb 9.6 oz (95.1 kg)  SpO2 98%   BMI 29.23 kg/m , BMI Body mass index is 29.23 kg/m.  Wt Readings from Last 3 Encounters:  07/22/19 209 lb 9.6 oz (95.1 kg)  06/26/19 205 lb (93 kg)  01/07/19 202 lb (91.6 kg)    General: Patient appears comfortable at rest. Neck: Supple, no elevated JVP or carotid bruits, no thyromegaly. Lungs: Clear to auscultation, nonlabored breathing at rest. Cardiac: Regular rate and rhythm, no S3 or significant systolic murmur, no pericardial rub. Extremities: No pitting edema, distal pulses 2+. Skin: Warm and dry. Musculoskeletal: No kyphosis. Neuropsychiatric: Alert and oriented x3, affect  grossly appropriate.  ECG:  An ECG dated 07/22/2019.   was personally reviewed today and demonstrated:  Normal sinus rhythm rate of 61.  Recent Labwork: No results found for requested labs within last 8760 hours.  No results found for: CHOL, TRIG, HDL, CHOLHDL, VLDL, LDLCALC, LDLDIRECT  Other Studies Reviewed Today:  Lexiscan Myoview 03/02/2018:  No diagnostic ST segment changes to indicate ischemia.  No significant myocardial perfusion defects to indicate scar or ischemia in the setting of radiotracer uptake within the gut near the inferior wall.  This is a low risk study.  Nuclear stress EF: 64%.  Echocardiogram 02/19/2018: Study Conclusions  - Left ventricle: The cavity size was normal. Wall thickness was normal. Systolic function was normal. The estimated ejection fraction was in the range of 55% to 60%. Wall motion was normal; there were no regional wall motion abnormalities. Left ventricular diastolic function parameters were normal. - Aortic valve: Mildly calcified annulus. Trileaflet. There was mild regurgitation. - Mitral valve: Mildly calcified annulus. There was trivial regurgitation. - Right atrium: Central venous pressure (est): 3 mm Hg. - Atrial septum: No defect or patent foramen ovale was identified. - Tricuspid valve: There was trivial regurgitation. - Pulmonary arteries: PA peak pressure: 14 mm Hg (S). - Pericardium, extracardiac: There was no pericardial effusion  Assessment and Plan:  1. Paroxysmal atrial fibrillation (HCC)   2. Essential hypertension   3. Mild aortic regurgitation    1. Paroxysmal atrial fibrillation (HCC) Patient denies any recent significant palpitations other than transient episodes in the evening before re-dosing his flecainide.  EKG today shows normal sinus rhythm with a rate of 61.  No ectopy noted.  Continue diltiazem 30 mg every 6 as needed palpitations, flecainide 100 mg p.o. twice daily, Bystolic 10 mg daily,  Eliquis 5 mg p.o. twice daily  2. Essential hypertension Blood pressure is elevated on arrival at 140/80.  Patient states he has had a particularly stressful day.  States BP is usually in the 120s 130s systolic at home.  Continue Bystolic 10 mg daily.  3. Mild aortic regurgitation Last echo 2019 showed mild AR.  Patient is asymptomatic  Medication Adjustments/Labs and Tests Ordered: Current medicines are reviewed at length with the patient today.  Concerns regarding medicines are outlined above.   Disposition: Follow-up with Dr. Diona Browner or APP 6 months  Signed, Rennis Harding, NP 07/22/2019 4:20 PM    [facility] [address] [place], [place], [place] [address] Phone: [phone]; Fax: [phone]

## 2019-10-03 ENCOUNTER — Other Ambulatory Visit: Payer: Self-pay | Admitting: Cardiology

## 2019-10-12 ENCOUNTER — Encounter (HOSPITAL_COMMUNITY): Payer: Self-pay | Admitting: Nurse Practitioner

## 2019-10-12 ENCOUNTER — Telehealth: Payer: Self-pay | Admitting: Cardiology

## 2019-10-12 ENCOUNTER — Other Ambulatory Visit: Payer: Self-pay

## 2019-10-12 ENCOUNTER — Ambulatory Visit (HOSPITAL_COMMUNITY)
Admission: RE | Admit: 2019-10-12 | Discharge: 2019-10-12 | Disposition: A | Discharge status: Home / Self Care | Payer: BC Managed Care – PPO | Source: Ambulatory Visit | Attending: Nurse Practitioner | Admitting: Nurse Practitioner

## 2019-10-12 VITALS — BP 154/96 | HR 124 | Ht 71.0 in | Wt 205.2 lb

## 2019-10-12 DIAGNOSIS — D6869 Other thrombophilia: Secondary | ICD-10-CM

## 2019-10-12 DIAGNOSIS — Z7901 Long term (current) use of anticoagulants: Secondary | ICD-10-CM | POA: Insufficient documentation

## 2019-10-12 DIAGNOSIS — I10 Essential (primary) hypertension: Secondary | ICD-10-CM | POA: Insufficient documentation

## 2019-10-12 DIAGNOSIS — I4430 Unspecified atrioventricular block: Secondary | ICD-10-CM | POA: Diagnosis not present

## 2019-10-12 DIAGNOSIS — Z79899 Other long term (current) drug therapy: Secondary | ICD-10-CM | POA: Diagnosis not present

## 2019-10-12 DIAGNOSIS — Z87891 Personal history of nicotine dependence: Secondary | ICD-10-CM | POA: Insufficient documentation

## 2019-10-12 DIAGNOSIS — K219 Gastro-esophageal reflux disease without esophagitis: Secondary | ICD-10-CM | POA: Insufficient documentation

## 2019-10-12 DIAGNOSIS — I4892 Unspecified atrial flutter: Secondary | ICD-10-CM | POA: Diagnosis not present

## 2019-10-12 DIAGNOSIS — E785 Hyperlipidemia, unspecified: Secondary | ICD-10-CM | POA: Diagnosis not present

## 2019-10-12 DIAGNOSIS — I483 Typical atrial flutter: Secondary | ICD-10-CM | POA: Diagnosis not present

## 2019-10-12 DIAGNOSIS — I4891 Unspecified atrial fibrillation: Secondary | ICD-10-CM | POA: Insufficient documentation

## 2019-10-12 DIAGNOSIS — I480 Paroxysmal atrial fibrillation: Secondary | ICD-10-CM | POA: Diagnosis not present

## 2019-10-12 NOTE — Telephone Encounter (Signed)
Sit down for 5-10 minutes prior to taking - BP was 136/102  HR 92.  States that he can feel when he is AFib.  Did take his Bystolic this morning as well.

## 2019-10-12 NOTE — Telephone Encounter (Signed)
Takes Flecainide 100mg  twice a day & has taken his as needed Cardizem 30mg  - took one dose last evening & one dose this morning at 6:30 am.  Has not checked his bp or heart rate.  States that he can feel that it is elevated.  Does notice a little lightheadedness.  Pain in right jaw yesterday - lasted about 1-2 hours - layed down & that went away - noticed that as soon as he noticed the rhythm had went into Afib.  No SOB.  States that he is actually at work now.  No nurse at work for him to go see.  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  He will walk up to the nurses station & check himself.  He will call back with reading.

## 2019-10-12 NOTE — Progress Notes (Signed)
Primary Care Physician: Benita Stabile, MD Referring Physician: Dr. Arlis Porta is a 59 y.o. male with a h/o afib for several years, dating back to 2014.  He has been treated with flecainide 100 mg bid and Bystolic  10 mg daily, 30 mg cardizem as needed.  But  recently he has had increasing afib burden. He has had a couple of visits to [facility] ER for dilt drip which has slowed pt and eventually he will revert to SR at home. Ekg's show typical aflutter as well as afib.  He is in the afib clinic to discuss options to treat afib. He is anticoagulated with Eliquis 5 mg bid for a CHA2DS2VASc score of 1. He is in rapid atrail flutter today.   Today, he denies symptoms of palpitations, chest pain, shortness of breath, orthopnea, PND, lower extremity edema, dizziness, presyncope, syncope, or neurologic sequela. The patient is tolerating medications without difficulties and is otherwise without complaint today.   Past Medical History:  Diagnosis Date  . Atrial fibrillation (HCC)    CHADSVASC score 1  . Esophageal stricture    Multiple dilations  . GERD (gastroesophageal reflux disease)   . Hyperlipidemia   . Hypertension    Past Surgical History:  Procedure Laterality Date  . Lumbar luminectomy  2007   Dr. Coletta Memos  . Partial finger amputation      Current Outpatient Medications  Medication Sig Dispense Refill  . ELIQUIS 5 MG TABS tablet TAKE 1 TABLET BY MOUTH TWICE A DAY 180 tablet 1  . diltiazem (CARDIZEM) 30 MG tablet Take 1 tablet (30 mg total) by mouth every 6 (six) hours as needed. PALPITATIONS 60 tablet 0  . flecainide (TAMBOCOR) 100 MG tablet TAKE 1 TABLET BY MOUTH TWICE A DAY 180 tablet 1  . nebivolol (BYSTOLIC) 10 MG tablet Take 10 mg by mouth daily.      Marland Kitchen NEXLIZET 180-10 MG TABS Take 1 tablet by mouth daily.    Marland Kitchen omeprazole (PRILOSEC) 40 MG capsule Take 40 mg by mouth daily.     No current facility-administered medications for this encounter.    No Known  Allergies  Social History   Socioeconomic History  . Marital status: Married    Spouse name: Not on file  . Number of children: Not on file  . Years of education: Not on file  . Highest education level: Not on file  Occupational History  . Not on file  Tobacco Use  . Smoking status: Former Smoker    Packs/day: 0.50    Years: 15.00    Pack years: 7.50    Types: Cigarettes    Quit date: 12/30/2008    Years since quitting: 10.7  . Smokeless tobacco: Current User    Types: Snuff, Chew  . Tobacco comment: dips 2 cans/snuff per week  Substance and Sexual Activity  . Alcohol use: No    Alcohol/week: 0.0 standard drinks  . Drug use: No  . Sexual activity: Not on file  Other Topics Concern  . Not on file  Social History Narrative  . Not on file   Social Determinants of Health   Financial Resource Strain:   . Difficulty of Paying Living Expenses:   Food Insecurity:   . Worried About Programme researcher, broadcasting/film/video in the Last Year:   . Barista in the Last Year:   Transportation Needs:   . Freight forwarder (Medical):   Marland Kitchen Lack of  Transportation (Non-Medical):   Physical Activity:   . Days of Exercise per Week:   . Minutes of Exercise per Session:   Stress:   . Feeling of Stress :   Social Connections:   . Frequency of Communication with Friends and Family:   . Frequency of Social Gatherings with Friends and Family:   . Attends Religious Services:   . Active Member of Clubs or Organizations:   . Attends Banker Meetings:   Marland Kitchen Marital Status:   Intimate Partner Violence:   . Fear of Current or Ex-Partner:   . Emotionally Abused:   Marland Kitchen Physically Abused:   . Sexually Abused:     Family History  Problem Relation Age of Onset  . Heart failure Father        Died age 75  . Heart attack Brother        Age 61  . Aortic stenosis Brother        Bicuspid valve status post AVR    ROS- All systems are reviewed and negative except as per the HPI  above  Physical Exam: There were no vitals filed for this visit. Wt Readings from Last 3 Encounters:  07/22/19 95.1 kg  06/26/19 93 kg  01/07/19 91.6 kg    Labs: Lab Results  Component Value Date   NA 139 06/08/2018   K 3.8 06/08/2018   CL 105 06/08/2018   CO2 26 06/08/2018   GLUCOSE 110 (H) 06/08/2018   BUN 15 06/08/2018   CREATININE 1.32 (H) 06/08/2018   CALCIUM 9.1 06/08/2018   Lab Results  Component Value Date   INR 1.06 07/12/2012   No results found for: CHOL, HDL, LDLCALC, TRIG   GEN- The patient is well appearing, alert and oriented x 3 today.   Head- normocephalic, atraumatic Eyes-  Sclera clear, conjunctiva pink Ears- hearing intact Oropharynx- clear Neck- supple, no JVP Lymph- no cervical lymphadenopathy Lungs- Clear to ausculation bilaterally, normal work of breathing Heart- Regular rate and rhythm, no murmurs, rubs or gallops, PMI not laterally displaced GI- soft, NT, ND, + BS Extremities- no clubbing, cyanosis, or edema MS- no significant deformity or atrophy Skin- no rash or lesion Psych- euthymic mood, full affect Neuro- strength and sensation are intact  EKG-typical atrial flutter at 124 bpm Echo- 2019-Study Conclusions   - Left ventricle: The cavity size was normal. Wall thickness was  normal. Systolic function was normal. The estimated ejection  fraction was in the range of 55% to 60%. Wall motion was normal;  there were no regional wall motion abnormalities. Left  ventricular diastolic function parameters were normal.  - Aortic valve: Mildly calcified annulus. Trileaflet. There was  mild regurgitation.  - Mitral valve: Mildly calcified annulus. There was trivial  regurgitation.  - Right atrium: Central venous pressure (est): 3 mm Hg.  - Atrial septum: No defect or patent foramen ovale was identified.  - Tricuspid valve: There was trivial regurgitation.  - Pulmonary arteries: PA peak pressure: 14 mm Hg (S).  - Pericardium,  extracardiac: There was no pericardial effusion.    Assessment and Plan: 1. Afib Wesley [name] /flutter Increasing afib burden Flecainide is not controlling arrhythmia well Discussed with pt re ablation vrs change to [place] He is interested in ablation He will continue 30 mg Cardizem every 4 hours until he returns to SR  He can report to the  [facility] ER if he gets uncomfortable or he can report to office and we will set up for outpt cardioversion  if he does not return to SR in the next 48 hours I will update echo when returns to SR I will request a consult with EP to discuss ablation   2. CHA2DS2VASc score of 1  Continue  eliquis 5 mg bid   Elvina Sidle. [facility] [address] [place], [place] [address] [phone]

## 2019-10-12 NOTE — Patient Instructions (Signed)
Continue using cardizem 30mg  tablets every 4 hours as needed for elevated heart rate

## 2019-10-12 NOTE — Telephone Encounter (Signed)
Call placed to AFib clinic - they can see him today at 3:00.    Patient aware & appreciative of the appointment.  He will go to entrance C & use the valet parking.

## 2019-10-12 NOTE — Telephone Encounter (Signed)
Patient c/o Palpitations:  High priority if patient c/o lightheadedness, shortness of breath, or chest pain  1) How long have you had palpitations/irregular HR/ Afib? Are you having the symptoms now? Since yesterday 10/11/2019   2) Are you currently experiencing lightheadedness, SOB or CP? lightheadedness  3) Do you have a history of afib (atrial fibrillation) or irregular heart rhythm? yes  4) Have you checked your BP or HR? (document readings if available): no   5) Are you experiencing any other symptoms?  Had some pain in his right jaw yesterday

## 2019-10-13 ENCOUNTER — Other Ambulatory Visit (HOSPITAL_COMMUNITY): Payer: Self-pay | Admitting: *Deleted

## 2019-10-13 ENCOUNTER — Telehealth (HOSPITAL_COMMUNITY): Payer: Self-pay | Admitting: *Deleted

## 2019-10-13 DIAGNOSIS — I483 Typical atrial flutter: Secondary | ICD-10-CM

## 2019-10-13 NOTE — Telephone Encounter (Signed)
Pt called to report he converted back into NSR over night feeling much improved today. Per Rudi Coco NP request will order echocardiogram and then consultation with EP for afib/flutter ablation consideration. Pt in agreement will call if issues arise.

## 2019-10-19 ENCOUNTER — Encounter: Payer: Self-pay | Admitting: Internal Medicine

## 2019-10-27 ENCOUNTER — Other Ambulatory Visit: Payer: Self-pay

## 2019-10-27 ENCOUNTER — Ambulatory Visit (HOSPITAL_COMMUNITY)
Admission: RE | Admit: 2019-10-27 | Discharge: 2019-10-27 | Disposition: A | Discharge status: Home / Self Care | Payer: BC Managed Care – PPO | Source: Ambulatory Visit | Attending: Nurse Practitioner | Admitting: Nurse Practitioner

## 2019-10-27 DIAGNOSIS — I483 Typical atrial flutter: Secondary | ICD-10-CM | POA: Insufficient documentation

## 2019-10-27 LAB — ECHOCARDIOGRAM COMPLETE
Area-P 1/2: 4.21 cm2
P 1/2 time: 459 msec
S' Lateral: 2.69 cm

## 2019-10-27 NOTE — Progress Notes (Signed)
*  PRELIMINARY RESULTS* Echocardiogram 2D Echocardiogram has been performed.  Wesley [name] 10/27/2019, 3:10 PM

## 2019-10-28 ENCOUNTER — Encounter (HOSPITAL_COMMUNITY): Payer: Self-pay | Admitting: *Deleted

## 2019-11-22 ENCOUNTER — Telehealth (INDEPENDENT_AMBULATORY_CARE_PROVIDER_SITE_OTHER): Payer: BC Managed Care – PPO | Admitting: Internal Medicine

## 2019-11-22 ENCOUNTER — Other Ambulatory Visit: Payer: Self-pay

## 2019-11-22 ENCOUNTER — Encounter: Payer: Self-pay | Admitting: Internal Medicine

## 2019-11-22 VITALS — Ht 71.0 in | Wt 205.0 lb

## 2019-11-22 DIAGNOSIS — D6869 Other thrombophilia: Secondary | ICD-10-CM

## 2019-11-22 DIAGNOSIS — I480 Paroxysmal atrial fibrillation: Secondary | ICD-10-CM | POA: Diagnosis not present

## 2019-11-22 DIAGNOSIS — I10 Essential (primary) hypertension: Secondary | ICD-10-CM

## 2019-11-22 DIAGNOSIS — I483 Typical atrial flutter: Secondary | ICD-10-CM

## 2019-11-22 NOTE — Progress Notes (Signed)
Electrophysiology TeleHealth Note   Due to national recommendations of social distancing due to COVID 19, Audio telehealth visit is felt to be most appropriate for this patient at this time.  See MyChart message from today for patient consent regarding telehealth for [facility].   Date:  11/22/2019   ID:  [name], DOB 11/12/1960, MRN [id number]  Location: home  Provider location: [place] Evaluation Performed: New patient consult  PCP:  Benita Stabile, MD  Cardiologist:  Nona Dell, MD Electrophysiologist:  None   Chief Complaint:  afib  History of Present Illness:    Wesley [name] is a 59 y.o. male who presents via audio conferencing for a telehealth visit today.   The patient is referred for new consultation regarding afib by Dr Diona Browner and Rudi Coco NP in the AF clinic. He reports having afib since at least 2014.  He initially was treated with flecainide as a pill in pocket.  Due to increasing afib, he was started on twice daily flecainide.  He continues to have increasing frequency and duration of afib since that time.  He feels that stress is a major cause for his afib.  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  He finds this quite stressful. He reports symptoms of palpitations and fatigue.  He reports feeling weak and washed out during afib. He has had several ER visits for afib.  Today, he denies symptoms of palpitations, chest pain, shortness of breath, orthopnea, PND, lower extremity edema, claudication, dizziness, presyncope, syncope, bleeding, or neurologic sequela. The patient is tolerating medications without difficulties and is otherwise without complaint today.     Past Medical History:  Diagnosis Date  . Atrial fibrillation (HCC)    CHADSVASC score 1  . Esophageal stricture    Multiple dilations  . GERD (gastroesophageal reflux disease)   . Hyperlipidemia   . Hypertension     Past Surgical History:  Procedure Laterality Date    . Lumbar luminectomy  2007   Dr. Coletta Memos  . Partial finger amputation      Current Outpatient Medications  Medication Sig Dispense Refill  . diltiazem (CARDIZEM) 30 MG tablet Take 1 tablet (30 mg total) by mouth every 6 (six) hours as needed. PALPITATIONS 60 tablet 0  . ELIQUIS 5 MG TABS tablet TAKE 1 TABLET BY MOUTH TWICE A DAY 180 tablet 1  . flecainide (TAMBOCOR) 100 MG tablet TAKE 1 TABLET BY MOUTH TWICE A DAY 180 tablet 1  . nebivolol (BYSTOLIC) 10 MG tablet Take 10 mg by mouth daily.      Marland Kitchen NEXLIZET 180-10 MG TABS Take 1 tablet by mouth daily.    Marland Kitchen omeprazole (PRILOSEC) 40 MG capsule Take 40 mg by mouth daily.     No current facility-administered medications for this visit.    Allergies:   Patient has no known allergies.   Social History:  The patient  reports that he quit smoking about 10 years ago. His smoking use included cigarettes. He has a 7.50 pack-year smoking history. His smokeless tobacco use includes snuff and chew. He reports that he does not drink alcohol and does not use drugs.   Family History:  The patient's family history includes Aortic stenosis in his brother; Heart attack in his brother; Heart failure in his father.    ROS:  Please see the history of present illness.   All other systems are personally reviewed and negative.    Exam:  Vital Signs:  Ht 5\' 11"  (1.803 m)   Wt 205 lb (93 kg)   BMI 28.59 kg/m   Well sounding, alert and conversant    Labs/Other Tests and Data Reviewed:    Recent Labs: No results found for requested labs within last 8760 hours.   Wt Readings from Last 3 Encounters:  11/22/19 205 lb (93 kg)  10/12/19 205 lb 3.2 oz (93.1 kg)  07/22/19 209 lb 9.6 oz (95.1 kg)     Other studies personally reviewed: Additional studies/ records that were reviewed today include: office notes, prior echo, ekgs  Review of the above records today demonstrates: as above  ekg 10/12/19- typical atrial flutter  ASSESSMENT & PLAN:     1.  Paroxysmal atrial fibrillation/ typical atrial flutter The patient has symptomatic, recurrent atrial fibrillation/ atrial flutter. he has failed medical therapy with flecainide. Chads2vasc score is 1.  he is anticoagulated with eliquis . Therapeutic strategies for afib including medicine (multaq, tikosyn, amiodarone) and ablation were discussed in detail with the patient today. Risk, benefits, and alternatives to EP study and radiofrequency ablation for afib were also discussed in detail today. These risks include but are not limited to stroke, bleeding, vascular damage, tamponade, perforation, damage to the esophagus, lungs, and other structures, pulmonary vein stenosis, worsening renal function, and death. The patient understands these risk and wishes to proceed.  We will therefore proceed with catheter ablation at the next available time.  Carto, ICE, anesthesia are requested for the procedure.  Will also obtain cardic CT prior to the procedure to exclude LAA thrombus and further evaluate atrial anatomy.  2. HTN Stable No change required today   Patient Risk:  after full review of this patients clinical status, I feel that they are at moderate risk at this time.   Today, I have spent 20 minutes with the patient with telehealth technology discussing afib.     Medication instructions morning of: The patient should hold ALL medications the morning of the procedure   Discharge: Our plan will be to discharge the patient same day after a period of observation          Signed, 10/14/19 MD, [facility] 11/22/2019 9:08 AM   [facility] [address] [place] [phone] (office) [phone] (fax)

## 2019-11-26 ENCOUNTER — Telehealth: Payer: Self-pay

## 2019-11-26 ENCOUNTER — Other Ambulatory Visit: Payer: Self-pay | Admitting: Cardiology

## 2019-11-26 NOTE — Telephone Encounter (Signed)
Spoke with Pt.  He is working right now, but requests some potential procedure dates be sent via MyChart.  He will review and reply to this nurse.

## 2019-11-26 NOTE — Telephone Encounter (Signed)
-----   Message from Hillis Range, MD sent at 11/22/2019  9:13 AM EDT ----- Afib ablation C/I/A  Cardiac CT

## 2019-12-24 DIAGNOSIS — J060 Acute laryngopharyngitis: Secondary | ICD-10-CM | POA: Diagnosis not present

## 2019-12-24 DIAGNOSIS — Z712 Person consulting for explanation of examination or test findings: Secondary | ICD-10-CM | POA: Diagnosis not present

## 2019-12-24 DIAGNOSIS — R079 Chest pain, unspecified: Secondary | ICD-10-CM | POA: Diagnosis not present

## 2019-12-24 DIAGNOSIS — E875 Hyperkalemia: Secondary | ICD-10-CM | POA: Diagnosis not present

## 2019-12-24 DIAGNOSIS — J069 Acute upper respiratory infection, unspecified: Secondary | ICD-10-CM | POA: Diagnosis not present

## 2020-01-12 NOTE — Telephone Encounter (Signed)
Left message to call back  

## 2020-01-27 ENCOUNTER — Ambulatory Visit: Payer: BC Managed Care – PPO | Admitting: Cardiology

## 2020-01-30 NOTE — Progress Notes (Signed)
Cardiology Office Note  Date: 01/31/2020   ID: Wesley [name], DOB 1960-10-08, MRN [id number]  PCP:  Benita Stabile, MD  Cardiologist:  Nona Dell, MD Electrophysiologist:  None   Chief Complaint: Follow-up PAF, HTN, HLD  History of Present Illness: Wesley [name] is a 59 y.o. male with a history of PAF, HTN, HLD.  Last encounter 01/07/2019 via telemedicine with Dr. Diona Browner.  He reported brief palpitations consistent with his atrial fibrillation.  Had one prolonged episode 1 morning when waking up with no recurrence.  Denied any exertional chest pain or syncope.  He was not having any bleeding problems on Eliquis.  He was tolerating flecainide, Bystolic and as needed diltiazem.  At last visit had been doing well from cardiac standpoint.  Noticing occasional palpitations mostly after the first 12 hours of flecainide. Had to redose with the second evening dose and may notice a few palpitations but nothing prolonged or sustained.  He denied being symptomatic when these transient palpitations occur.  He stated usually an hour after taking the second dose of flecainide in the evening, the palpitations were alleviated.    Last encounter with Dr. Johney Frame via telemedicine visit 11/22/2019.  He was taking flecainide twice daily.  Per Dr. Johney Frame patient had remained symptomatic in spite of medical therapy with flecainide.  He was to be scheduled for EP study and RF ablation for atrial fibrillation.  Hypertension was stable.   He is here today for 9-month follow-up.  He continues to have palpitations usually in the evening when approaching the time to take his second dose of flecainide.  He states he can always tell when it is time to take the second dose of medication.  He states it is like a switch  turned on and he starts having the palpitations.  States once he takes the second dose of flecainide the palpitations resolve.  He states he plans on scheduling the EPS study and RF ablation soon with Dr.  Jenel Lucks office.  Otherwise he denies any other issues today.  Past Medical History:  Diagnosis Date  . Atrial fibrillation (HCC)    CHADSVASC score 1  . Esophageal stricture    Multiple dilations  . GERD (gastroesophageal reflux disease)   . Hyperlipidemia   . Hypertension     Past Surgical History:  Procedure Laterality Date  . Lumbar luminectomy  2007   Dr. Coletta Memos  . Partial finger amputation      Current Outpatient Medications  Medication Sig Dispense Refill  . diltiazem (CARDIZEM) 30 MG tablet Take 1 tablet (30 mg total) by mouth every 6 (six) hours as needed. PALPITATIONS 60 tablet 0  . ELIQUIS 5 MG TABS tablet TAKE 1 TABLET BY MOUTH TWICE A DAY 180 tablet 1  . flecainide (TAMBOCOR) 100 MG tablet TAKE 1 TABLET BY MOUTH TWICE A DAY 180 tablet 3  . nebivolol (BYSTOLIC) 10 MG tablet Take 10 mg by mouth daily.      Marland Kitchen omeprazole (PRILOSEC) 40 MG capsule Take 40 mg by mouth daily.     No current facility-administered medications for this visit.   Allergies:  Patient has no known allergies.   Social History: The patient  reports that he quit smoking about 11 years ago. His smoking use included cigarettes. He has a 7.50 pack-year smoking history. His smokeless tobacco use includes snuff and chew. He reports that he does not drink alcohol and does not use drugs.   Family History: The patient's family  history includes Aortic stenosis in his brother; Heart attack in his brother; Heart failure in his father.   ROS:  Please see the history of present illness. Otherwise, complete review of systems is positive for none.  All other systems are reviewed and negative.   Physical Exam: VS:  BP 116/72   Pulse 68   Ht 5\' 10"  (1.778 m)   Wt 209 lb (94.8 kg)   SpO2 95%   BMI 29.99 kg/m , BMI Body mass index is 29.99 kg/m.  Wt Readings from Last 3 Encounters:  01/31/20 209 lb (94.8 kg)  11/22/19 205 lb (93 kg)  10/12/19 205 lb 3.2 oz (93.1 kg)    General: Patient appears  comfortable at rest. Neck: Supple, no elevated JVP or carotid bruits, no thyromegaly. Lungs: Clear to auscultation, nonlabored breathing at rest. Cardiac: Regular rate and rhythm, no S3 or significant systolic murmur, no pericardial rub. Extremities: No pitting edema, distal pulses 2+. Skin: Warm and dry. Musculoskeletal: No kyphosis. Neuropsychiatric: Alert and oriented x3, affect grossly appropriate.  ECG:  An ECG dated 07/22/2019.   was personally reviewed today and demonstrated:  Normal sinus rhythm rate of 61.  Recent Labwork: No results found for requested labs within last 8760 hours.  No results found for: CHOL, TRIG, HDL, CHOLHDL, VLDL, LDLCALC, LDLDIRECT  Other Studies Reviewed Today:  Lexiscan Myoview 03/02/2018:  No diagnostic ST segment changes to indicate ischemia.  No significant myocardial perfusion defects to indicate scar or ischemia in the setting of radiotracer uptake within the gut near the inferior wall.  This is a low risk study.  Nuclear stress EF: 64%.  Echocardiogram 02/19/2018: Study Conclusions  - Left ventricle: The cavity size was normal. Wall thickness was normal. Systolic function was normal. The estimated ejection fraction was in the range of 55% to 60%. Wall motion was normal; there were no regional wall motion abnormalities. Left ventricular diastolic function parameters were normal. - Aortic valve: Mildly calcified annulus. Trileaflet. There was mild regurgitation. - Mitral valve: Mildly calcified annulus. There was trivial regurgitation. - Right atrium: Central venous pressure (est): 3 mm Hg. - Atrial septum: No defect or patent foramen ovale was identified. - Tricuspid valve: There was trivial regurgitation. - Pulmonary arteries: PA peak pressure: 14 mm Hg (S). - Pericardium, extracardiac: There was no pericardial effusion  Assessment and Plan:   1. Paroxysmal atrial fibrillation (HCC) Patient denies any recent  significant palpitations other than transient episodes in the evening before re-dosing his flecainide.   Continue diltiazem 30 mg every 6 as needed palpitations, flecainide 100 mg p.o. twice daily, Bystolic 10 mg daily, Eliquis 5 mg p.o. twice daily.  He states he plans to schedule the EPS study and RF ablation with Dr. 02/21/2018 soon.   2. Essential hypertension Blood pressure well controlled today.  BP 116/72.  Continue Bystolic 10 mg daily.   3. Mild aortic regurgitation Last echo 2019 showed mild AR.  Patient is asymptomatic.   Medication Adjustments/Labs and Tests Ordered: Current medicines are reviewed at length with the patient today.  Concerns regarding medicines are outlined above.   Disposition: Follow-up with Dr. 2020 or APP 3 months or as directed by EP after ablation.  Signed, Diona Browner, NP 01/31/2020 4:10 PM    [facility] [address] [place], [place], [place] Phone: [phone]; Fax: [phone]

## 2020-01-31 ENCOUNTER — Ambulatory Visit: Payer: BC Managed Care – PPO | Admitting: Family Medicine

## 2020-01-31 ENCOUNTER — Encounter: Payer: Self-pay | Admitting: Family Medicine

## 2020-01-31 ENCOUNTER — Encounter: Payer: Self-pay | Admitting: *Deleted

## 2020-01-31 VITALS — BP 116/72 | HR 68 | Ht 70.0 in | Wt 209.0 lb

## 2020-01-31 DIAGNOSIS — I10 Essential (primary) hypertension: Secondary | ICD-10-CM

## 2020-01-31 DIAGNOSIS — I480 Paroxysmal atrial fibrillation: Secondary | ICD-10-CM

## 2020-01-31 NOTE — Telephone Encounter (Signed)
Mailed letter after unsuccessful attempt to contact.

## 2020-01-31 NOTE — Patient Instructions (Signed)
Medication Instructions:  Continue all current medications.  Labwork: none  Testing/Procedures: none  Follow-Up: 3 months   Any Other Special Instructions Will Be Listed Below (If Applicable).  If you need a refill on your cardiac medications before your next appointment, please call your pharmacy.  

## 2020-02-09 ENCOUNTER — Encounter: Payer: Self-pay | Admitting: Internal Medicine

## 2020-03-16 ENCOUNTER — Encounter: Payer: Self-pay | Admitting: Internal Medicine

## 2020-03-16 ENCOUNTER — Ambulatory Visit: Payer: BC Managed Care – PPO | Admitting: Internal Medicine

## 2020-03-16 ENCOUNTER — Other Ambulatory Visit: Payer: Self-pay

## 2020-03-16 ENCOUNTER — Telehealth: Payer: Self-pay

## 2020-03-16 VITALS — BP 133/74 | HR 58 | Temp 97.3°F | Ht 71.0 in | Wt 204.0 lb

## 2020-03-16 DIAGNOSIS — K219 Gastro-esophageal reflux disease without esophagitis: Secondary | ICD-10-CM

## 2020-03-16 DIAGNOSIS — Z1211 Encounter for screening for malignant neoplasm of colon: Secondary | ICD-10-CM | POA: Diagnosis not present

## 2020-03-16 NOTE — Telephone Encounter (Signed)
Noted. Routing to Dr. Marletta Lor and [facility]

## 2020-03-16 NOTE — Patient Instructions (Signed)
We will schedule you for colonoscopy for colon cancer screening.  At the same time we will perform EGD to evaluate your chronic reflux and for screening for Barrett's esophagus.  Continue on omeprazole daily.  Call if you need any refills.  At [facility] we value your feedback. You may receive a survey about your visit today. Please share your experience as we strive to create trusting relationships with our patients to provide genuine, compassionate, quality care.  We appreciate your understanding and patience as we review any laboratory studies, imaging, and other diagnostic tests that are ordered as we care for you. Our office policy is 5 business days for review of these results, and any emergent or urgent results are addressed in a timely manner for your best interest. If you do not hear from our office in 1 week, please contact us.   We also encourage the use of MyChart, which contains your medical information for your review as well. If you are not enrolled in this feature, an access code is on this after visit summary for your convenience. Thank you for allowing [place] to be involved in your care.  It was great to see you today!  I hope you have a great rest of your winter!!    Hennie Duos. Marletta Lor, D.O. Gastroenterology and Hepatology [facility]

## 2020-03-16 NOTE — Progress Notes (Signed)
Primary Care Physician:  Benita Stabile, MD Primary Gastroenterologist:  Dr. Marletta Lor  Chief Complaint  Patient presents with  . Colonoscopy    Never had tcs. No problems. No fhcrc. Has A.Fib (may need ablation 1st of year)    HPI:   Wesley [name] is a 59 y.o. male who presents to the clinic today by referral from PCP Dr. Margo Aye for evaluation.  Patient has never undergone colon cancer screening in the past, any modality.  Wishes to schedule screening colonoscopy at this time.  He does have chronic reflux which is relatively well controlled on omeprazole 40 mg daily.  Had an EGD in 2007 for acute esophageal food bolus.  Since being on omeprazole he states his symptoms are relatively well controlled.  No dysphagia or odynophagia at this time.  No H. pylori or history of PUD that he knows of.  No chronic NSAID use.  Is chronically taking Eliquis for A. fib.  No family history of colorectal malignancy.  No unintentional weight loss.  Past Medical History:  Diagnosis Date  . Atrial fibrillation (HCC)    CHADSVASC score 1  . Esophageal stricture    Multiple dilations  . GERD (gastroesophageal reflux disease)   . Hyperlipidemia   . Hypertension     Past Surgical History:  Procedure Laterality Date  . Lumbar luminectomy  2007   Dr. Coletta Memos  . Partial finger amputation      Current Outpatient Medications  Medication Sig Dispense Refill  . diltiazem (CARDIZEM) 30 MG tablet Take 1 tablet (30 mg total) by mouth every 6 (six) hours as needed. PALPITATIONS 60 tablet 0  . ELIQUIS 5 MG TABS tablet TAKE 1 TABLET BY MOUTH TWICE A DAY 180 tablet 1  . flecainide (TAMBOCOR) 100 MG tablet TAKE 1 TABLET BY MOUTH TWICE A DAY 180 tablet 3  . nebivolol (BYSTOLIC) 10 MG tablet Take 10 mg by mouth daily.    Marland Kitchen omeprazole (PRILOSEC) 40 MG capsule Take 40 mg by mouth daily.    . rosuvastatin (CRESTOR) 5 MG tablet Take 1 tablet by mouth 2 (two) times a week.     No current facility-administered  medications for this visit.    Allergies as of 03/16/2020  . (No Known Allergies)    Family History  Problem Relation Age of Onset  . Heart failure Father        Died age 76  . Heart attack Brother        Age 32  . Aortic stenosis Brother        Bicuspid valve status post AVR    Social History   Socioeconomic History  . Marital status: Married    Spouse name: Not on file  . Number of children: Not on file  . Years of education: Not on file  . Highest education level: Not on file  Occupational History  . Not on file  Tobacco Use  . Smoking status: Former Smoker    Packs/day: 0.50    Years: 15.00    Pack years: 7.50    Types: Cigarettes    Quit date: 12/30/2008    Years since quitting: 11.2  . Smokeless tobacco: Current User    Types: Snuff, Chew  . Tobacco comment: dips 2 cans/snuff per week  Substance and Sexual Activity  . Alcohol use: Yes    Alcohol/week: 0.0 standard drinks    Comment: rare beer  . Drug use: No  . Sexual activity: Not on  file  Other Topics Concern  . Not on file  Social History Narrative   Lives in [place]   Works as a Production designer, theatre/television/film at a [employer].   Social Determinants of Health   Financial Resource Strain: Not on file  Food Insecurity: Not on file  Transportation Needs: Not on file  Physical Activity: Not on file  Stress: Not on file  Social Connections: Not on file  Intimate Partner Violence: Not on file    Subjective: Review of Systems  Constitutional: Negative for chills and fever.  HENT: Negative for congestion and hearing loss.   Eyes: Negative for blurred vision and double vision.  Respiratory: Negative for cough and shortness of breath.   Cardiovascular: Negative for chest pain and palpitations.  Gastrointestinal: Positive for heartburn. Negative for abdominal pain, blood in stool, constipation, diarrhea, melena and vomiting.  Genitourinary: Negative for dysuria and urgency.  Musculoskeletal: Negative for joint pain and  myalgias.  Skin: Negative for itching and rash.  Neurological: Negative for dizziness and headaches.  Psychiatric/Behavioral: Negative for depression. The patient is not nervous/anxious.        Objective: BP 133/74   Pulse (!) 58   Temp (!) 97.3 F (36.3 C) (Temporal)   Ht 5\' 11"  (1.803 m)   Wt 204 lb (92.5 kg)   BMI 28.45 kg/m  Physical Exam Constitutional:      Appearance: Normal appearance.  HENT:     Head: Normocephalic and atraumatic.  Eyes:     Extraocular Movements: Extraocular movements intact.     Conjunctiva/sclera: Conjunctivae normal.  Cardiovascular:     Rate and Rhythm: Normal rate and regular rhythm.  Pulmonary:     Effort: Pulmonary effort is normal.     Breath sounds: Normal breath sounds.  Abdominal:     General: Bowel sounds are normal.     Palpations: Abdomen is soft.  Musculoskeletal:        General: Normal range of motion.     Cervical back: Normal range of motion and neck supple.  Skin:    General: Skin is warm.  Neurological:     General: No focal deficit present.     Mental Status: He is alert and oriented to person, place, and time.  Psychiatric:        Mood and Affect: Mood normal.        Behavior: Behavior normal.      Assessment: *Chronic GERD-well-controlled on omeprazole *Colon cancer screening  Plan: Discussed colon cancer screening in depth with patient today.  We will schedule him for screening colonoscopy in the near future.  Patient is white male with reflux greater than 5 years and is at risk for Barrett's esophagus.  At the same time of colonoscopy we will perform EGD to both evaluate his chronic reflux as well as screen for Barrett's esophagus.  Also has a history of acute food bolus in the esophagus 15 years ago.  The risks including infection, bleed, or perforation as well as benefits, limitations, alternatives and imponderables have been reviewed with the patient. Potential for esophageal dilation, biopsy, etc. have also  been reviewed.  Questions have been answered. All parties agreeable.   We will reach out to patient's cardiologist for their blessing on holding patient's Eliquis for 2 days prior to colonoscopy.  Appreciate their input.  Thank you Dr. for the kind referral.   03/16/2020 4:12 PM   Disclaimer: This note was dictated with voice recognition software. Similar sounding words can inadvertently be  transcribed and may not be corrected upon review.

## 2020-03-16 NOTE — Telephone Encounter (Signed)
Yes, he may hold Eliquis as requested prior to endoscopy.

## 2020-03-16 NOTE — Telephone Encounter (Signed)
Dr. Diona Browner, pt was seen in our office today. Pt is due for an Upper Endoscopy and Colonoscopy per Dr. Marletta Lor. Please advise if it's ok for pt to hold his Eliquis x 48 hours prior to procedures. Thank you.

## 2020-03-17 NOTE — Telephone Encounter (Signed)
Tried to call pt, LMOVM for return call. 

## 2020-03-20 NOTE — Telephone Encounter (Signed)
Tried to call pt, LMOVM for return call. 

## 2020-03-21 NOTE — Telephone Encounter (Signed)
Letter mailed

## 2020-03-22 DIAGNOSIS — J069 Acute upper respiratory infection, unspecified: Secondary | ICD-10-CM | POA: Diagnosis not present

## 2020-03-22 DIAGNOSIS — J019 Acute sinusitis, unspecified: Secondary | ICD-10-CM | POA: Diagnosis not present

## 2020-04-09 ENCOUNTER — Encounter: Payer: Self-pay | Admitting: Emergency Medicine

## 2020-04-09 ENCOUNTER — Ambulatory Visit
Admission: EM | Admit: 2020-04-09 | Discharge: 2020-04-09 | Disposition: A | Discharge status: Home / Self Care | Payer: BC Managed Care – PPO | Attending: Family Medicine | Admitting: Family Medicine

## 2020-04-09 ENCOUNTER — Other Ambulatory Visit: Payer: Self-pay

## 2020-04-09 DIAGNOSIS — Z9189 Other specified personal risk factors, not elsewhere classified: Secondary | ICD-10-CM | POA: Diagnosis not present

## 2020-04-09 NOTE — ED Notes (Signed)
Nurse visit only

## 2020-04-09 NOTE — ED Triage Notes (Signed)
Cough and fever since Saturday.  Using tylenol for pain and fever.  Took home test that was positive.  Work will not accept home test, wants covid test done.

## 2020-04-12 LAB — COVID-19, FLU A+B NAA
Influenza A, NAA: NOT DETECTED
Influenza B, NAA: NOT DETECTED
SARS-CoV-2, NAA: DETECTED — AB

## 2020-05-09 ENCOUNTER — Ambulatory Visit (INDEPENDENT_AMBULATORY_CARE_PROVIDER_SITE_OTHER): Payer: BC Managed Care – PPO | Admitting: Cardiology

## 2020-05-09 ENCOUNTER — Encounter: Payer: Self-pay | Admitting: Cardiology

## 2020-05-09 ENCOUNTER — Other Ambulatory Visit: Payer: Self-pay

## 2020-05-09 VITALS — BP 122/78 | HR 68 | Ht 71.0 in | Wt 200.0 lb

## 2020-05-09 DIAGNOSIS — I480 Paroxysmal atrial fibrillation: Secondary | ICD-10-CM | POA: Diagnosis not present

## 2020-05-09 DIAGNOSIS — I483 Typical atrial flutter: Secondary | ICD-10-CM | POA: Diagnosis not present

## 2020-05-09 NOTE — Patient Instructions (Addendum)
Medication Instructions:   Your physician recommends that you continue on your current medications as directed. Please refer to the Current Medication list given to you today.  Labwork:  none  Testing/Procedures:  none  Follow-Up:  Your physician recommends that you schedule a follow-up appointment in: 6 months.  Your physician recommends that you schedule a follow-up appointment in: first available with Dr. Johney Frame.   Any Other Special Instructions Will Be Listed Below (If Applicable).  If you need a refill on your cardiac medications before your next appointment, please call your pharmacy.

## 2020-05-09 NOTE — Progress Notes (Signed)
Cardiology Office Note  Date: 05/09/2020   ID: Wesley [name], DOB 1960/05/12, MRN [id number]  PCP:  Benita Stabile, MD  Cardiologist:  Nona Dell, MD Electrophysiologist:  None   Chief Complaint  Patient presents with  . Cardiac follow-up    History of Present Illness: Wesley [name] is a 60 y.o. male last seen in November 2021 by Mr. Vincenza Hews NP.  He presents for a routine follow-up visit.  States that he has been doing well from a rhythm perspective.  He has a history of PAF, also typical atrial flutter.  He saw Dr. Johney Frame in August 2021 and discussed possibility of proceeding with an atrial flutter ablation.  Subsequent to that however, patient put this on hold with various family members sick with COVID-19, and no major breakthrough episodes of atrial arrhythmias.  He had planned to schedule this in early January, but he himself tested positive for COVID-19.  He tells me that he got over this, interestingly no increasing palpitations or rhythm burden during that time.  He is in sinus rhythm today by ECG.  I reviewed his medications which have been stable from a cardiac perspective.   Past Medical History:  Diagnosis Date  . Atrial fibrillation (HCC)    CHADSVASC score 1  . Esophageal stricture    Multiple dilations  . GERD (gastroesophageal reflux disease)   . Hyperlipidemia   . Hypertension     Past Surgical History:  Procedure Laterality Date  . Lumbar luminectomy  2007   Dr. Coletta Memos  . Partial finger amputation      Current Outpatient Medications  Medication Sig Dispense Refill  . diltiazem (CARDIZEM) 30 MG tablet Take 1 tablet (30 mg total) by mouth every 6 (six) hours as needed. PALPITATIONS 60 tablet 0  . ELIQUIS 5 MG TABS tablet TAKE 1 TABLET BY MOUTH TWICE A DAY 180 tablet 1  . flecainide (TAMBOCOR) 100 MG tablet TAKE 1 TABLET BY MOUTH TWICE A DAY 180 tablet 3  . nebivolol (BYSTOLIC) 10 MG tablet Take 10 mg by mouth daily.    Marland Kitchen omeprazole (PRILOSEC) 40 MG  capsule Take 40 mg by mouth daily.    . rosuvastatin (CRESTOR) 5 MG tablet Take 1 tablet by mouth 2 (two) times a week.     No current facility-administered medications for this visit.   Allergies:  Patient has no known allergies.   ROS: No dizziness or syncope.  Physical Exam: VS:  BP 122/78   Pulse 68   Ht 5\' 11"  (1.803 m)   Wt 200 lb (90.7 kg)   SpO2 99%   BMI 27.89 kg/m , BMI Body mass index is 27.89 kg/m.  Wt Readings from Last 3 Encounters:  05/09/20 200 lb (90.7 kg)  04/09/20 205 lb (93 kg)  03/16/20 204 lb (92.5 kg)    General: Patient appears comfortable at rest. HEENT: Conjunctiva and lids normal, wearing a mask. Neck: Supple, no elevated JVP or carotid bruits, no thyromegaly. Lungs: Clear to auscultation, nonlabored breathing at rest. Cardiac: Regular rate and rhythm, no S3, soft systolic murmur, no pericardial rub. Extremities: No pitting edema.  ECG:  An ECG dated 10/12/2019 was personally reviewed today and demonstrated:  Atrial flutter with 2:1 block.  Recent Labwork:  September 2021: Hemoglobin 14.8, platelets 200, BUN 18, creatinine 1.31, potassium 4.6, AST 16, ALT 16, cholesterol 226, triglycerides 166, HDL 37, LDL 159, hemoglobin A1c 5.6%  Other Studies Reviewed Today:  Echocardiogram 10/27/2019: 1. Left ventricular  ejection fraction, by estimation, is 60 to 65%. The  left ventricle has normal function. The left ventricle has no regional  wall motion abnormalities. There is mild left ventricular hypertrophy.  Left ventricular diastolic parameters  were normal.  2. Right ventricular systolic function is normal. The right ventricular  size is normal.  3. The mitral valve is grossly normal. Trivial mitral valve  regurgitation.  4. The aortic valve has an indeterminant number of cusps. Aortic valve  regurgitation is mild to moderate. Aortic regurgitation PHT measures 459  msec.  5. The inferior vena cava is normal in size with greater than 50%   respiratory variability, suggesting right atrial pressure of 3 mmHg.   Assessment and Plan:  1.  Paroxysmal atrial fibrillation and typical atrial flutter, CHA2DS2-VASc score is 1.  He has done reasonably well with no prolonged palpitations since August 2021.  Still has intermittent palpitations anywhere from a few minutes to 30 minutes.  He is in sinus rhythm today by ECG.  Plan to continue present medications including Eliquis, flecainide, Bystolic, and as needed use of short acting Cardizem which he has not used recently.  We will get him back in to talk with Dr. Johney Frame about whether atrial flutter ablation would still be pursued at this point, uncertain whether atrial fibrillation ablation was also reviewed.  2.  Aortic regurgitation, mild to moderate by follow-up echocardiogram in July 2021.  He is asymptomatic.  Medication Adjustments/Labs and Tests Ordered: Current medicines are reviewed at length with the patient today.  Concerns regarding medicines are outlined above.   Tests Ordered: Orders Placed This Encounter  Procedures  . EKG 12-Lead    Medication Changes: No orders of the defined types were placed in this encounter.   Disposition:  Follow up 6 months in the [place] office.  Signed, Jonelle Sidle, MD, [facility] 05/09/2020 3:40 PM    [facility] at [facility] [address] [place], [place], [place] [address] Phone: [phone]; Fax: [phone]

## 2020-06-30 ENCOUNTER — Encounter: Payer: Self-pay | Admitting: Internal Medicine

## 2020-06-30 ENCOUNTER — Ambulatory Visit: Payer: BC Managed Care – PPO | Admitting: Internal Medicine

## 2020-06-30 VITALS — BP 136/88 | HR 58 | Ht 71.0 in | Wt 202.0 lb

## 2020-06-30 DIAGNOSIS — I480 Paroxysmal atrial fibrillation: Secondary | ICD-10-CM

## 2020-06-30 DIAGNOSIS — I10 Essential (primary) hypertension: Secondary | ICD-10-CM

## 2020-06-30 DIAGNOSIS — I483 Typical atrial flutter: Secondary | ICD-10-CM

## 2020-06-30 NOTE — Progress Notes (Signed)
PCP: Benita Stabile, MD Primary Cardiologist: Dr Diona Browner Primary EP: Dr Cristela Blue is a 60 y.o. male who presents today for routine electrophysiology followup.  Since last being seen in our clinic, the patient reports doing very well. I saw him last fall and offered ablation.  He verbally consented but then did not follow-through.  He continues to have intermittent palpitations from afib.  Episodes are mostly rare and short lived.  Today, he denies symptoms of chest pain, shortness of breath,  lower extremity edema, dizziness, presyncope, or syncope.  The patient is otherwise without complaint today.   Past Medical History:  Diagnosis Date  . Atrial fibrillation (HCC)    CHADSVASC score 1  . Esophageal stricture    Multiple dilations  . GERD (gastroesophageal reflux disease)   . Hyperlipidemia   . Hypertension    Past Surgical History:  Procedure Laterality Date  . Lumbar luminectomy  2007   Dr. Coletta Memos  . Partial finger amputation      ROS- all systems are reviewed and negatives except as per HPI above  Current Outpatient Medications  Medication Sig Dispense Refill  . diltiazem (CARDIZEM) 30 MG tablet Take 1 tablet (30 mg total) by mouth every 6 (six) hours as needed. PALPITATIONS 60 tablet 0  . ELIQUIS 5 MG TABS tablet TAKE 1 TABLET BY MOUTH TWICE A DAY 180 tablet 1  . flecainide (TAMBOCOR) 100 MG tablet TAKE 1 TABLET BY MOUTH TWICE A DAY 180 tablet 3  . nebivolol (BYSTOLIC) 10 MG tablet Take 10 mg by mouth daily.    Marland Kitchen omeprazole (PRILOSEC) 40 MG capsule Take 40 mg by mouth daily.    . rosuvastatin (CRESTOR) 5 MG tablet Take 1 tablet by mouth 2 (two) times a week.     No current facility-administered medications for this visit.    Physical Exam: Vitals:   06/30/20 0819  BP: 136/88  Pulse: (!) 58  SpO2: 99%  Weight: 202 lb (91.6 kg)  Height: 5\' 11"  (1.803 m)    GEN- The patient is well appearing, alert and oriented x 3 today.   Head- normocephalic,  atraumatic Eyes-  Sclera clear, conjunctiva pink Ears- hearing intact Oropharynx- clear Lungs- normal work of breathing Heart- Regular rate and rhythm  GI- soft  Extremities- no clubbing, cyanosis, or edema  Wt Readings from Last 3 Encounters:  06/30/20 202 lb (91.6 kg)  05/09/20 200 lb (90.7 kg)  04/09/20 205 lb (93 kg)    EKG tracing ordered today is personally reviewed and shows sinus bradycardia  Assessment and Plan:  1. Paroxysmal atrial fibrillation/ typical atrial flutter The patient has symptomatic, recurrent atrial arrhythmias Chads2vasc score is 1.  he is anticoagulated with eliquis . Therapeutic strategies for afib including medicine and ablation were discussed in detail with the patient today. Risk, benefits, and alternatives to EP study and radiofrequency ablation for afib were also discussed in detail today.  At this time, he would like to defer ablation.  He may reconsider if his arrhythmia burden increases. He feels that he is doing reasonably well with flecainide.  We will need to follow him closely on this medicine to avoid toxicity.  2. HTN Stable No change required today   Risks, benefits and potential toxicities for medications prescribed and/or refilled reviewed with patient today.   Return in 6 months If he has worsening AF in the interim, he should contact the AF clinic to have ablation arranged.  06/07/20 MD,  [facility] 06/30/2020 8:46 AM

## 2020-06-30 NOTE — Patient Instructions (Signed)
Medication Instructions:  Continue all current medications.   Labwork: none  Testing/Procedures: none  Follow-Up: 6 months   Any Other Special Instructions Will Be Listed Below (If Applicable).   If you need a refill on your cardiac medications before your next appointment, please call your pharmacy.  

## 2020-11-20 ENCOUNTER — Other Ambulatory Visit: Payer: Self-pay | Admitting: Cardiology

## 2020-11-22 ENCOUNTER — Ambulatory Visit: Payer: BC Managed Care – PPO | Admitting: Cardiology

## 2020-11-22 ENCOUNTER — Encounter: Payer: Self-pay | Admitting: Cardiology

## 2020-11-22 VITALS — BP 140/84 | HR 64 | Ht 71.0 in | Wt 207.4 lb

## 2020-11-22 DIAGNOSIS — I351 Nonrheumatic aortic (valve) insufficiency: Secondary | ICD-10-CM | POA: Diagnosis not present

## 2020-11-22 DIAGNOSIS — I480 Paroxysmal atrial fibrillation: Secondary | ICD-10-CM | POA: Diagnosis not present

## 2020-11-22 NOTE — Progress Notes (Signed)
Cardiology Office Note  Date: 11/22/2020   ID: Wesley [name], DOB [birth year], MRN [id number]  PCP:  Benita Stabile, MD  Cardiologist:  Nona Dell, MD Electrophysiologist:  None   Chief Complaint  Patient presents with   Cardiac follow-up    History of Present Illness: Wesley [name] is a 60 y.o. male last seen in February.  He had a follow-up visit with Dr. Johney Frame in April as well, I reviewed the note.  He does not report any progressive palpitations or prolonged breakthrough atrial fibrillation events.  Atrial fibrillation ablation has already been discussed and he is decided to hold off for now unless his symptoms worsen.  He continues to work full-time at [employer].  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  I reviewed his medications which are noted below.  He will see Dr. Margo Aye next month for follow-up lab work.  No reported bleeding problems on Eliquis.  Past Medical History:  Diagnosis Date   Atrial fibrillation (HCC)    CHADSVASC score 1   Esophageal stricture    Multiple dilations   GERD (gastroesophageal reflux disease)    Hyperlipidemia    Hypertension     Past Surgical History:  Procedure Laterality Date   Lumbar luminectomy  2007   Dr. Coletta Memos   Partial finger amputation      Current Outpatient Medications  Medication Sig Dispense Refill   diltiazem (CARDIZEM) 30 MG tablet Take 1 tablet (30 mg total) by mouth every 6 (six) hours as needed. PALPITATIONS 60 tablet 0   ELIQUIS 5 MG TABS tablet TAKE 1 TABLET BY MOUTH TWICE A DAY 180 tablet 1   flecainide (TAMBOCOR) 100 MG tablet TAKE 1 TABLET BY MOUTH TWICE A DAY 180 tablet 3   nebivolol (BYSTOLIC) 10 MG tablet Take 10 mg by mouth daily.     omeprazole (PRILOSEC) 40 MG capsule Take 40 mg by mouth daily.     rosuvastatin (CRESTOR) 5 MG tablet Take 1 tablet by mouth 2 (two) times a week.     No current facility-administered medications for this visit.   Allergies:  Patient has no known  allergies.   ROS: No chest pain or syncope.  Physical Exam: VS:  BP 140/84   Pulse 64   Ht 5\' 11"  (1.803 m)   Wt 207 lb 6.4 oz (94.1 kg)   SpO2 98%   BMI 28.93 kg/m , BMI Body mass index is 28.93 kg/m.  Wt Readings from Last 3 Encounters:  11/22/20 207 lb 6.4 oz (94.1 kg)  06/30/20 202 lb (91.6 kg)  05/09/20 200 lb (90.7 kg)    General: Patient appears comfortable at rest. HEENT: Conjunctiva and lids normal, wearing a mask. Neck: Supple, no elevated JVP or carotid bruits, no thyromegaly. Lungs: Clear to auscultation, nonlabored breathing at rest. Cardiac: Regular rate and rhythm, no S3, 1/6 systolic murmur. Extremities: No pitting edema.  ECG:  An ECG dated 06/30/2020 was personally reviewed today and demonstrated:  Sinus bradycardia.  Recent Labwork:  September 2021: Hemoglobin 14.8, platelets 200, BUN 18, creatinine 1.31, potassium 4.6, AST 16, ALT 16, cholesterol 226, triglycerides 166, HDL 37, LDL 159, hemoglobin A1c 5.6%  Other Studies Reviewed Today:  Echocardiogram 10/27/2019:  1. Left ventricular ejection fraction, by estimation, is 60 to 65%. The  left ventricle has normal function. The left ventricle has no regional  wall motion abnormalities. There is mild left ventricular hypertrophy.  Left ventricular diastolic parameters  were  normal.   2. Right ventricular systolic function is normal. The right ventricular  size is normal.   3. The mitral valve is grossly normal. Trivial mitral valve  regurgitation.   4. The aortic valve has an indeterminant number of cusps. Aortic valve  regurgitation is mild to moderate. Aortic regurgitation PHT measures 459  msec.   5. The inferior vena cava is normal in size with greater than 50%  respiratory variability, suggesting right atrial pressure of 3 mmHg.   Assessment and Plan:  1.  Paroxysmal atrial fibrillation and typical atrial flutter with CHA2DS2-VASc score of 1.  He remains on Eliquis for stroke prophylaxis.  We  have continued flecainide and Bystolic with as needed short acting Cardizem and he reports adequate symptom control at this point.  Already evaluated by Dr. Johney Frame and would consider ablation if symptoms worsen.  2.  Mild to moderate aortic regurgitation.  Continue to follow.  He is asymptomatic at this time.  Echocardiogram from July of last year is noted above.  Medication Adjustments/Labs and Tests Ordered: Current medicines are reviewed at length with the patient today.  Concerns regarding medicines are outlined above.   Tests Ordered: No orders of the defined types were placed in this encounter.   Medication Changes: No orders of the defined types were placed in this encounter.   Disposition:  Follow up  6 months.  Signed, Jonelle Sidle, MD, [facility] 11/22/2020 4:20 PM    [facility] at [facility] [address] [place], [place], [place] [address] Phone: [phone]; Fax: [phone]

## 2020-11-22 NOTE — Patient Instructions (Addendum)

## 2021-01-05 ENCOUNTER — Ambulatory Visit: Payer: BC Managed Care – PPO | Admitting: Internal Medicine

## 2021-01-22 ENCOUNTER — Other Ambulatory Visit: Payer: Self-pay | Admitting: Cardiology

## 2021-01-22 DIAGNOSIS — I480 Paroxysmal atrial fibrillation: Secondary | ICD-10-CM

## 2021-01-23 ENCOUNTER — Other Ambulatory Visit: Payer: Self-pay | Admitting: *Deleted

## 2021-01-23 MED ORDER — APIXABAN 5 MG PO TABS: 5.0000 mg | ORAL_TABLET | Freq: Two times a day (BID) | ORAL | 0 refills | Status: DC

## 2021-01-23 NOTE — Telephone Encounter (Signed)
Orders placed for a cbc and bmet. Will send in refill so that pt does not run out of medication.

## 2021-01-23 NOTE — Telephone Encounter (Signed)
Please see the other refill encounter from today for further documentation.  Refill sent for Eliquis.

## 2021-01-23 NOTE — Telephone Encounter (Signed)
Prescription refill request for Eliquis received. Indication: afib  Last office visit: 11/22/2020, [place] Scr: 1.31, 12/24/2019 Age: 60 yo  Weight: 94.1 kg   Pt is overdue for blood work. Attempted to call pt, no answer. Pt is scheduled to see Dr. Johney Frame on 11/4, put on appointment note that pt will needs updated blood work for Eliquis follow up.

## 2021-02-02 ENCOUNTER — Ambulatory Visit: Payer: BC Managed Care – PPO | Admitting: Internal Medicine

## 2021-03-09 ENCOUNTER — Ambulatory Visit: Payer: BC Managed Care – PPO | Admitting: Internal Medicine

## 2021-04-28 ENCOUNTER — Other Ambulatory Visit: Payer: Self-pay | Admitting: Cardiology

## 2021-04-30 NOTE — Telephone Encounter (Signed)
Prescription refill request for Eliquis received. Indication: PAF Last office visit: 11/22/20  Ival Bible MD Scr: 1.31 on 12/24/19 Age: 61 Weight: 94.1kg  Based on above findings Eliquis 5mg  twice daily is the appropriate dose.  Patient is past due for lab work.  Has an appt with Dr on 05/28/21.  Request CBC/BMP at that time.  Refill approved x 1.

## 2021-05-28 ENCOUNTER — Ambulatory Visit (INDEPENDENT_AMBULATORY_CARE_PROVIDER_SITE_OTHER): Payer: BC Managed Care – PPO | Admitting: Cardiology

## 2021-05-28 ENCOUNTER — Other Ambulatory Visit: Payer: Self-pay

## 2021-05-28 ENCOUNTER — Encounter: Payer: Self-pay | Admitting: Cardiology

## 2021-05-28 VITALS — BP 132/90 | HR 68 | Ht 71.0 in | Wt 206.0 lb

## 2021-05-28 DIAGNOSIS — I480 Paroxysmal atrial fibrillation: Secondary | ICD-10-CM

## 2021-05-28 DIAGNOSIS — T466X5A Adverse effect of antihyperlipidemic and antiarteriosclerotic drugs, initial encounter: Secondary | ICD-10-CM

## 2021-05-28 DIAGNOSIS — E782 Mixed hyperlipidemia: Secondary | ICD-10-CM | POA: Diagnosis not present

## 2021-05-28 DIAGNOSIS — M791 Myalgia, unspecified site: Secondary | ICD-10-CM

## 2021-05-28 NOTE — Progress Notes (Signed)
Cardiology Office Note  Date: 05/28/2021   ID: SABATINO PEABODY, DOB 16-Mar-1961, MRN [id number]  PCP:  Benita Stabile, MD  Cardiologist:  Nona Dell, MD Electrophysiologist:  None   Chief Complaint  Patient presents with   Cardiac follow-up    History of Present Illness: Wesley [name] is a 61 y.o. male last seen in August 2022.  He is here for a follow-up visit.  Reports adequate control of breakthrough atrial fibrillation with current regimen as noted below.  He is due for follow-up lab work with PCP, does not report any spontaneous bleeding problems on Eliquis.  We went over his medications.  He was not able to tolerate Crestor due to myalgias, has had other statin intolerance in the past.  He will be having lab work with Dr. Margo Aye, LDL was in the 150s back in September 2021.  He has known aortic atherosclerosis by prior CT imaging.  May need to consider referral to lipid clinic to discuss other treatment options.  I personally reviewed his ECG today which shows sinus rhythm with normal intervals.  Past Medical History:  Diagnosis Date   Aortic atherosclerosis (HCC)    Atrial fibrillation (HCC)    Esophageal stricture    Multiple dilations   GERD (gastroesophageal reflux disease)    Hyperlipidemia    Hypertension     Past Surgical History:  Procedure Laterality Date   Lumbar luminectomy  2007   Dr. Coletta Memos   Partial finger amputation      Current Outpatient Medications  Medication Sig Dispense Refill   apixaban (ELIQUIS) 5 MG TABS tablet TAKE 1 TABLET BY MOUTH TWICE A DAY 60 tablet 0   diltiazem (CARDIZEM) 30 MG tablet Take 1 tablet (30 mg total) by mouth every 6 (six) hours as needed. PALPITATIONS 60 tablet 0   flecainide (TAMBOCOR) 100 MG tablet TAKE 1 TABLET BY MOUTH TWICE A DAY 180 tablet 3   nebivolol (BYSTOLIC) 10 MG tablet Take 10 mg by mouth daily.     omeprazole (PRILOSEC) 40 MG capsule Take 40 mg by mouth daily.     No current facility-administered  medications for this visit.   Allergies:  Patient has no known allergies.   ROS:  No syncope.  Physical Exam: VS:  BP 132/90    Pulse 68    Ht 5\' 11"  (1.803 m)    Wt 206 lb (93.4 kg)    SpO2 96%    BMI 28.73 kg/m , BMI Body mass index is 28.73 kg/m.  Wt Readings from Last 3 Encounters:  05/28/21 206 lb (93.4 kg)  11/22/20 207 lb 6.4 oz (94.1 kg)  06/30/20 202 lb (91.6 kg)    General: Patient appears comfortable at rest. HEENT: Conjunctiva and lids normal, wearing a mask. Neck: Supple, no elevated JVP or carotid bruits, no thyromegaly. Lungs: Clear to auscultation, nonlabored breathing at rest. Cardiac: Regular rate and rhythm, no S3, 1/6 systolic murmur. Extremities: No pitting edema.  ECG:  An ECG dated 06/30/2020 was personally reviewed today and demonstrated:  Sinus bradycardia.  Recent Labwork:  September 2021: Hemoglobin 14.8, platelets 200, BUN 18, creatinine 1.31, potassium 4.6, AST 16, ALT 16, cholesterol 226, triglycerides 166, HDL 37, LDL 159, hemoglobin A1c 5.6%  Other Studies Reviewed Today:  Echocardiogram 10/27/2019:  1. Left ventricular ejection fraction, by estimation, is 60 to 65%. The  left ventricle has normal function. The left ventricle has no regional  wall motion abnormalities. There is mild left ventricular  hypertrophy.  Left ventricular diastolic parameters  were normal.   2. Right ventricular systolic function is normal. The right ventricular  size is normal.   3. The mitral valve is grossly normal. Trivial mitral valve  regurgitation.   4. The aortic valve has an indeterminant number of cusps. Aortic valve  regurgitation is mild to moderate. Aortic regurgitation PHT measures 459  msec.   5. The inferior vena cava is normal in size with greater than 50%  respiratory variability, suggesting right atrial pressure of 3 mmHg.   Assessment and Plan:  1.  Paroxysmal atrial fibrillation with CHA2DS2-VASc score of 2.  He is on Eliquis for stroke  prophylaxis, also continues on flecainide and Bystolic with short acting Cardizem as needed.  He remains comfortable with current rhythm control, has discussed possible ablation in the past with Dr. Rayann Heman.  Due for follow-up lab work with PCP, I reviewed his ECG.  2.  Mild to moderate aortic regurgitation by echocardiogram in July 2021.  No change in examination.  3.  Aortic atherosclerosis by CT imaging.  Ischemic work-up in 2019 was low risk.  He has had statin intolerance due to myalgias.  Follow-up FLP with PCP.  May need to consider referral to the lipid clinic to discuss other options.  Medication Adjustments/Labs and Tests Ordered: Current medicines are reviewed at length with the patient today.  Concerns regarding medicines are outlined above.   Tests Ordered: Orders Placed This Encounter  Procedures   EKG 12-Lead    Medication Changes: No orders of the defined types were placed in this encounter.   Disposition:  Follow up  6 months.  Signed, Satira Sark, MD, [facility] 05/28/2021 3:55 PM    [facility] [place], [place], [place] [address] Phone: [phone]; Fax: [phone]

## 2021-05-28 NOTE — Patient Instructions (Signed)
Medication Instructions:  Continue all current medications.   Labwork: none  Testing/Procedures: none  Follow-Up: 6 months   Any Other Special Instructions Will Be Listed Below (If Applicable).   If you need a refill on your cardiac medications before your next appointment, please call your pharmacy.  

## 2021-08-08 ENCOUNTER — Other Ambulatory Visit: Payer: Self-pay | Admitting: Cardiology

## 2021-08-08 DIAGNOSIS — Z79899 Other long term (current) drug therapy: Secondary | ICD-10-CM

## 2021-08-08 NOTE — Telephone Encounter (Addendum)
Prescription refill request for Eliquis received. ?Indication: PAF ?Last office visit: 05/28/21  Ival Bible MD ?Scr: 1.31 on 12/24/19 ?Age:  61 ?Weight: 93.4kg ? ?Based on above findings Eliquis 5mg  twice daily is the appropriate dose.  Past due for lab work.  Requested from PCP if available.  Have not received anything.  Message sent to [place] office to schedule lab work.  Refill approved x 1. ?

## 2021-08-09 NOTE — Telephone Encounter (Signed)
Called pt voice orders for BMET/CBC. Left a voicemail to have pt call office back.  ? ?Orders entered per Vashti Hey, RN for CBC/BMET- Eliquis refill ?

## 2021-09-03 ENCOUNTER — Ambulatory Visit
Admission: EM | Admit: 2021-09-03 | Discharge: 2021-09-03 | Disposition: A | Discharge status: Home / Self Care | Payer: BC Managed Care – PPO | Attending: Nurse Practitioner | Admitting: Nurse Practitioner

## 2021-09-03 ENCOUNTER — Telehealth: Payer: Self-pay | Admitting: Nurse Practitioner

## 2021-09-03 DIAGNOSIS — J069 Acute upper respiratory infection, unspecified: Secondary | ICD-10-CM

## 2021-09-03 DIAGNOSIS — Z1152 Encounter for screening for COVID-19: Secondary | ICD-10-CM

## 2021-09-03 MED ORDER — AMOXICILLIN-POT CLAVULANATE 875-125 MG PO TABS: 1.0000 | ORAL_TABLET | Freq: Two times a day (BID) | ORAL | 0 refills | Status: DC

## 2021-09-03 NOTE — ED Triage Notes (Signed)
Pt presents with c/o productive cough and nasal congestion and low grade fever

## 2021-09-03 NOTE — ED Provider Notes (Signed)
[facility]    CSN: [id number] Arrival date & time: 09/03/21  9983      History   Chief Complaint Chief Complaint  Patient presents with   Nasal Congestion   Cough   Fever    HPI Wesley [name] is a 61 y.o. male.   The history is provided by the patient.   Patient presents with upper respiratory symptoms that been present for the past 3 days.  He complains of low-grade temperature, fatigue, nasal congestion, postnasal drainage, sore throat, and cough.  He denies headache, shortness of breath, difficulty breathing, or GI symptoms.  Patient reports that he was exposed to COVID by a coworker.  He has been taking Mucinex and ibuprofen for symptoms.  Patient states symptoms worsened over the past 24 to 48 hours.  Patient does have a history of hypertension, and atrial fibrillation. Past Medical History:  Diagnosis Date   Aortic atherosclerosis (HCC)    Atrial fibrillation (HCC)    Esophageal stricture    Multiple dilations   GERD (gastroesophageal reflux disease)    Hyperlipidemia    Hypertension     Patient Active Problem List   Diagnosis Date Noted   Aortic valve disorders 05/08/2010   Paroxysmal atrial fibrillation (HCC) 05/08/2010   HYPERLIPIDEMIA-MIXED 05/07/2010   HYPERTENSION, BENIGN 05/07/2010   Palpitations 05/07/2010    Past Surgical History:  Procedure Laterality Date   Lumbar luminectomy  2007   Dr. Coletta Memos   Partial finger amputation         Home Medications    Prior to Admission medications   Medication Sig Start Date End Date Taking? Authorizing Provider  apixaban (ELIQUIS) 5 MG TABS tablet TAKE 1 TABLET BY MOUTH TWICE A DAY 08/09/21   Jonelle Sidle, MD  diltiazem (CARDIZEM) 30 MG tablet Take 1 tablet (30 mg total) by mouth every 6 (six) hours as needed. PALPITATIONS 01/30/18   Devoria Albe, MD  flecainide (TAMBOCOR) 100 MG tablet TAKE 1 TABLET BY MOUTH TWICE A DAY 11/20/20   Jonelle Sidle, MD  nebivolol (BYSTOLIC) 10 MG tablet  Take 10 mg by mouth daily.    [provider]  omeprazole (PRILOSEC) 40 MG capsule Take 40 mg by mouth daily. 05/28/19   [provider]    Family History Family History  Problem Relation Age of Onset   Heart failure Father        Died age 73   Heart attack Brother        Age 34   Aortic stenosis Brother        Bicuspid valve status post AVR    Social History Social History   Tobacco Use   Smoking status: Former    Packs/day: 0.50    Years: 15.00    Pack years: 7.50    Types: Cigarettes    Quit date: 12/30/2008    Years since quitting: 12.6   Smokeless tobacco: Current    Types: Snuff, Chew   Tobacco comments:    dips 2 cans/snuff per week  Substance Use Topics   Alcohol use: Yes    Alcohol/week: 0.0 standard drinks    Comment: rare beer   Drug use: No     Allergies   Patient has no known allergies.   Review of Systems Review of Systems Per HPI  Physical Exam Triage Vital Signs ED Triage Vitals  Enc Vitals Group     BP 09/03/21 1048 (!) 145/85     Pulse Rate  09/03/21 1048 63     Resp 09/03/21 1048 20     Temp 09/03/21 1048 98 F (36.7 C)     Temp src --      SpO2 09/03/21 1048 93 %     Weight --      Height --      Head Circumference --      Peak Flow --      Pain Score 09/03/21 1045 3     Pain Loc --      Pain Edu? --      Excl. in GC? --    No data found.  Updated Vital Signs BP (!) 145/85   Pulse 63   Temp 98 F (36.7 C)   Resp 20   SpO2 93%   Visual Acuity Right Eye Distance:   Left Eye Distance:   Bilateral Distance:    Right Eye Near:   Left Eye Near:    Bilateral Near:     Physical Exam Vitals and nursing note reviewed.  Constitutional:      General: He is not in acute distress.    Appearance: Normal appearance.  HENT:     Head: Normocephalic.     Right Ear: Tympanic membrane, ear canal and external ear normal.     Left Ear: Tympanic membrane, ear canal and external ear normal.     Nose: Congestion  present.     Mouth/Throat:     Pharynx: Oropharyngeal exudate and posterior oropharyngeal erythema present.  Eyes:     Extraocular Movements: Extraocular movements intact.     Conjunctiva/sclera: Conjunctivae normal.     Pupils: Pupils are equal, round, and reactive to light.  Cardiovascular:     Rate and Rhythm: Normal rate.     Pulses: Normal pulses.     Heart sounds: Normal heart sounds.  Pulmonary:     Effort: Pulmonary effort is normal.     Breath sounds: Normal breath sounds.  Abdominal:     General: Bowel sounds are normal.     Palpations: Abdomen is soft.  Musculoskeletal:     Cervical back: Normal range of motion.  Lymphadenopathy:     Cervical: No cervical adenopathy.  Skin:    General: Skin is warm and dry.  Neurological:     General: No focal deficit present.     Mental Status: He is alert and oriented to person, place, and time.  Psychiatric:        Mood and Affect: Mood normal.        Behavior: Behavior normal.     UC Treatments / Results  Labs (all labs ordered are listed, but only abnormal results are displayed) Labs Reviewed  COVID-19, FLU A+B NAA    EKG   Radiology No results found.  Procedures Procedures (including critical care time)  Medications Ordered in UC Medications - No data to display  Initial Impression / Assessment and Plan / UC Course  I have reviewed the triage vital signs and the nursing notes.  Pertinent labs & imaging results that were available during my care of the patient were reviewed by me and considered in my medical decision making (see chart for details).  COVID/influenza test are pending.  Patient presents with upper respiratory symptoms for the past 3 days.  His exam is reassuring, vital signs are stable.  Given the patient's history of A-fib and hypertension, we will start him on Augmentin to cover him for sinusitis.  Patient advised that if the COVID/flu  test are positive, he will be advised to stop the medication,  and recommend starting patient on an antiviral.  We will also provide symptomatic treatment with fluticasone and Tessalon Perles.  Supportive care recommendations were provided.  Patient advised to follow-up in our clinic if symptoms do not improve. Final Clinical Impressions(s) / UC Diagnoses   Final diagnoses:  Acute upper respiratory infection     Discharge Instructions      COVID/influenza test pending.  You will be contacted if the results are positive.  If your results are positive, you will be started on the antiviral, molnupiravir, as you do not have recent lab work. Take medication as prescribed.  As discussed, if your COVID test is positive, you will need to stop the Augmentin. Increase fluids and allow for plenty of rest. Recommend using humidifier at bedtime to help during sleep. Sleep elevated on 2 pillows. May take over-the-counter Tylenol as needed for pain, fever, or general discomfort. Follow-up in our clinic if your symptoms do not improve. Go to the emergency department if you develop shortness of breath, difficulty breathing, inability to speak in a complete sentence.     ED Prescriptions   None    PDMP not reviewed this encounter.   Abran Cantor, NP 09/03/21 1110

## 2021-09-03 NOTE — Discharge Instructions (Addendum)
COVID/influenza test pending.  You will be contacted if the results are positive.  If your results are positive, you will be started on the antiviral, molnupiravir, as you do not have recent lab work. Take medication as prescribed.  As discussed, if your COVID test is positive, you will need to stop the Augmentin. Increase fluids and allow for plenty of rest. Recommend using humidifier at bedtime to help during sleep. Sleep elevated on 2 pillows. May take over-the-counter Tylenol as needed for pain, fever, or general discomfort. Follow-up in our clinic if your symptoms do not improve. Go to the emergency department if you develop shortness of breath, difficulty breathing, inability to speak in a complete sentence.

## 2021-09-03 NOTE — Telephone Encounter (Signed)
Patient called stating Augmentin was not sent in. Prescription send to patient's preferred pharmacy. Patient notified of same.

## 2021-09-04 LAB — COVID-19, FLU A+B NAA
Influenza A, NAA: NOT DETECTED
Influenza B, NAA: NOT DETECTED
SARS-CoV-2, NAA: NOT DETECTED

## 2021-11-15 ENCOUNTER — Other Ambulatory Visit: Payer: Self-pay | Admitting: Cardiology

## 2021-11-23 DIAGNOSIS — K219 Gastro-esophageal reflux disease without esophagitis: Secondary | ICD-10-CM | POA: Diagnosis not present

## 2021-11-23 DIAGNOSIS — R109 Unspecified abdominal pain: Secondary | ICD-10-CM | POA: Diagnosis not present

## 2021-11-23 DIAGNOSIS — I10 Essential (primary) hypertension: Secondary | ICD-10-CM | POA: Diagnosis not present

## 2021-12-05 ENCOUNTER — Ambulatory Visit: Payer: BC Managed Care – PPO | Attending: Cardiology | Admitting: Cardiology

## 2021-12-05 ENCOUNTER — Encounter: Payer: Self-pay | Admitting: Cardiology

## 2021-12-05 VITALS — BP 128/86 | HR 71 | Ht 71.0 in | Wt 201.4 lb

## 2021-12-05 DIAGNOSIS — T466X5A Adverse effect of antihyperlipidemic and antiarteriosclerotic drugs, initial encounter: Secondary | ICD-10-CM

## 2021-12-05 DIAGNOSIS — I480 Paroxysmal atrial fibrillation: Secondary | ICD-10-CM

## 2021-12-05 DIAGNOSIS — M791 Myalgia, unspecified site: Secondary | ICD-10-CM | POA: Diagnosis not present

## 2021-12-05 DIAGNOSIS — I700 Atherosclerosis of aorta: Secondary | ICD-10-CM

## 2021-12-05 DIAGNOSIS — E782 Mixed hyperlipidemia: Secondary | ICD-10-CM | POA: Diagnosis not present

## 2021-12-05 NOTE — Progress Notes (Signed)
Cardiology Office Note  Date: 12/05/2021   ID: Wesley [name], DOB 06-16-1960, MRN [id number]  PCP:  Benita Stabile, MD  Cardiologist:  Nona Dell, MD Electrophysiologist:  None   Chief Complaint  Patient presents with   Cardiac follow-up    History of Present Illness: Wesley [name] is a 61 y.o. male last seen in February.  He is here for a follow-up visit.  Reports no progressive palpitations since last encounter with relatively good rhythm control.  He remains on flecainide and Eliquis with as needed Cardizem available.  Continues to work full-time and does have job stress.  He will have lab work soon with Dr. Margo Aye including clinical evaluation.  I have talked with him about lipid control, he has a history of statin intolerance due to myalgias.  His last LDL was 159 and he does have aortic atherosclerosis evident by CT imaging.  Past Medical History:  Diagnosis Date   Aortic atherosclerosis (HCC)    Atrial fibrillation (HCC)    Esophageal stricture    Multiple dilations   GERD (gastroesophageal reflux disease)    Hyperlipidemia    Hypertension     Past Surgical History:  Procedure Laterality Date   Lumbar luminectomy  2007   Dr. Coletta Memos   Partial finger amputation      Current Outpatient Medications  Medication Sig Dispense Refill   apixaban (ELIQUIS) 5 MG TABS tablet TAKE 1 TABLET BY MOUTH TWICE A DAY 60 tablet 1   diltiazem (CARDIZEM) 30 MG tablet Take 1 tablet (30 mg total) by mouth every 6 (six) hours as needed. PALPITATIONS 60 tablet 0   flecainide (TAMBOCOR) 100 MG tablet TAKE 1 TABLET BY MOUTH TWICE A DAY 180 tablet 1   nebivolol (BYSTOLIC) 10 MG tablet Take 10 mg by mouth daily.     pantoprazole (PROTONIX) 40 MG tablet Take 1 tablet by mouth daily.     omeprazole (PRILOSEC) 40 MG capsule Take 40 mg by mouth daily. (Patient not taking: Reported on 12/05/2021)     No current facility-administered medications for this visit.   Allergies:  Patient has no  known allergies.   ROS: No syncope.  Physical Exam: VS:  BP 128/86 (BP Location: Right Arm, Patient Position: Sitting, Cuff Size: Normal)   Pulse 71   Ht 5\' 11"  (1.803 m)   Wt 201 lb 6.4 oz (91.4 kg)   SpO2 96%   BMI 28.09 kg/m , BMI Body mass index is 28.09 kg/m.  Wt Readings from Last 3 Encounters:  12/05/21 201 lb 6.4 oz (91.4 kg)  05/28/21 206 lb (93.4 kg)  11/22/20 207 lb 6.4 oz (94.1 kg)    General: Patient appears comfortable at rest. HEENT: Conjunctiva and lids normal. Neck: Supple, no elevated JVP or carotid bruits. Lungs: Clear to auscultation, nonlabored breathing at rest. Cardiac: Regular rate and rhythm, no S3, 1/6 significant systolic murmur. Extremities: No pitting edema.  ECG:  An ECG dated 05/28/2021 was personally reviewed today and demonstrated:  Sinus rhythm.  Recent Labwork:  September 2021: Hemoglobin 14.8, platelets 200, BUN 18, creatinine 1.31, potassium 4.6, AST 16, ALT 16, cholesterol 226, triglycerides 166, HDL 37, LDL 159, hemoglobin A1c 5.6%  Other Studies Reviewed Today:  Echocardiogram 10/27/2019:  1. Left ventricular ejection fraction, by estimation, is 60 to 65%. The  left ventricle has normal function. The left ventricle has no regional  wall motion abnormalities. There is mild left ventricular hypertrophy.  Left ventricular diastolic parameters  were  normal.   2. Right ventricular systolic function is normal. The right ventricular  size is normal.   3. The mitral valve is grossly normal. Trivial mitral valve  regurgitation.   4. The aortic valve has an indeterminant number of cusps. Aortic valve  regurgitation is mild to moderate. Aortic regurgitation PHT measures 459  msec.   5. The inferior vena cava is normal in size with greater than 50%  respiratory variability, suggesting right atrial pressure of 3 mmHg.   Assessment and Plan:  1.  Paroxysmal atrial fibrillation with CHA2DS2-VASc score of 2.  Plan to continue Eliquis for  stroke prophylaxis, he will have lab work with PCP soon.  No spontaneous bleeding problems.  Otherwise continue flecainide with as needed short acting Cardizem.  If symptoms worsen we will plan atrial fibrillation clinic referral for discussion of other antiarrhythmic therapy versus consideration of ablation.  2.  Aortic atherosclerosis by CT imaging with hyperlipidemia, last LDL 159.  He has a history of statin intolerance due to myalgias.  He will have lab work with PCP soon.  I have talked with him about possibility of considering lipid clinic referral for discussion of PCSK9 inhibitors.  3.  Asymptomatic, mild to moderate aortic regurgitation by echocardiogram in July 2021.  No change in cardiac murmur.  We will consider follow-up surveillance imaging next year.  Medication Adjustments/Labs and Tests Ordered: Current medicines are reviewed at length with the patient today.  Concerns regarding medicines are outlined above.   Tests Ordered: No orders of the defined types were placed in this encounter.   Medication Changes: No orders of the defined types were placed in this encounter.   Disposition:  Follow up  6 months.  Signed, Jonelle Sidle, MD, [facility] 12/05/2021 4:25 PM    [facility] at [facility] [address] [place], [place], [place] [address] Phone: [phone]; Fax: [phone]

## 2021-12-05 NOTE — Patient Instructions (Addendum)

## 2021-12-14 DIAGNOSIS — R7301 Impaired fasting glucose: Secondary | ICD-10-CM | POA: Diagnosis not present

## 2021-12-14 DIAGNOSIS — I10 Essential (primary) hypertension: Secondary | ICD-10-CM | POA: Diagnosis not present

## 2021-12-26 ENCOUNTER — Encounter: Payer: Self-pay | Admitting: *Deleted

## 2022-01-15 ENCOUNTER — Telehealth: Payer: Self-pay | Admitting: *Deleted

## 2022-01-15 MED ORDER — EZETIMIBE 10 MG PO TABS: 10.0000 mg | ORAL_TABLET | Freq: Every day | ORAL | 3 refills | Status: DC

## 2022-01-15 NOTE — Telephone Encounter (Signed)
Patient informed and verbalized understanding of plan. 

## 2022-01-15 NOTE — Telephone Encounter (Signed)
-----   Message from Satira Sark, MD sent at 12/23/2021  6:08 PM EDT ----- Results reviewed.  Lipids show LDL 115.  Has a history of statin intolerance.  Consider starting Zetia 10 mg daily.

## 2022-01-18 DIAGNOSIS — R944 Abnormal results of kidney function studies: Secondary | ICD-10-CM | POA: Diagnosis not present

## 2022-01-18 DIAGNOSIS — Z125 Encounter for screening for malignant neoplasm of prostate: Secondary | ICD-10-CM | POA: Diagnosis not present

## 2022-01-18 DIAGNOSIS — R7301 Impaired fasting glucose: Secondary | ICD-10-CM | POA: Diagnosis not present

## 2022-01-18 DIAGNOSIS — R109 Unspecified abdominal pain: Secondary | ICD-10-CM | POA: Diagnosis not present

## 2022-01-18 DIAGNOSIS — I10 Essential (primary) hypertension: Secondary | ICD-10-CM | POA: Diagnosis not present

## 2022-01-23 NOTE — Progress Notes (Unsigned)
GI Office Note    Referring Provider: Benita Stabile, MD Primary Care Physician:  Benita Stabile, MD Primary Gastroenterologist: Hennie Duos. Marletta Lor, DO  Date:  01/24/2022  ID:  Wesley [name], DOB 01/08/1961, MRN [id number]   Chief Complaint   Chief Complaint  Patient presents with   Colonoscopy    Pt here for referral for colonoscopy and he is on Eliquis.    History of Present Illness  Wesley [name] is a 61 y.o. male with a history of GERD, A-fib on Eliquis, aortic atherosclerosis, HLD, HTN, history of esophageal strictures with history of multiple dilations, and food bolus in 2006 presenting today to discuss scheduling screening colonoscopy.    Patient last seen in the office in December 2021 by Dr. Marletta Lor to discuss screening colonoscopy.  Patient never had a colonoscopy.  He also reported chronic reflux that was controlled on omeprazole 40 mg daily.  No history of prior food bolus in 2007 when he underwent EGD.  Patient was scheduled for TCS to screen for colorectal cancer and EGD to screen for Barrett's esophagus given chronic reflux.  It appears the patient was unable to be reached by phone to schedule procedures.  Echocardiogram July 2021 with a EF of 60 to 65%. EKG February 2023 normal sinus rhythm with heart rate of 65 bpm.   Labs 12/16/2021: A1c 5.9, triglycerides 169, HDL 37, LDL 115, glucose 107, creatinine 1.41, GFR 57, normal LFTs, CBC within normal limits.  Today: Reports he had an EGD in the past with Dr. Karilyn Cota and had his esophagus stretched. Reports one christmas eve he ate a meal and had a food bolus and on that christmas day he had a disimpaction.   Taking pantoprazole 40 mg. Reports he had some epigastric pain for 1 week and he went to see Dr. Margo Aye. Used to take omeprazole. Reports he has taken prevacid and maybe tried nexium in the past as well. Reports he was given sucralfate and it improved. Reports a gnawing constant pain. Has had a lot of stress at work recently due  to change in software at work. Not currently having any pain, no burning. Does admit to eating quick in the past. Denies lack of appetite, early satiety, unintentional weight loss, melena, BRBPR, constipation, diarrhea. Densei dysphagia.   Has been experiencing some urinary urgency and some stop and go urination.  No family history of colon cancer or colon polyps.   Sees cardiology for Afib. Will take diltiazem as needed if he has RVR. Sees Dr. Diona Browner.   Denies any chest pain, shortness of breath, lower extremity.     Current Outpatient Medications  Medication Sig Dispense Refill   apixaban (ELIQUIS) 5 MG TABS tablet TAKE 1 TABLET BY MOUTH TWICE A DAY 60 tablet 1   diltiazem (CARDIZEM) 30 MG tablet Take 1 tablet (30 mg total) by mouth every 6 (six) hours as needed. PALPITATIONS 60 tablet 0   ezetimibe (ZETIA) 10 MG tablet Take 1 tablet (10 mg total) by mouth daily. 90 tablet 3   flecainide (TAMBOCOR) 100 MG tablet TAKE 1 TABLET BY MOUTH TWICE A DAY 180 tablet 1   nebivolol (BYSTOLIC) 10 MG tablet Take 10 mg by mouth daily.     pantoprazole (PROTONIX) 40 MG tablet Take 1 tablet by mouth daily.     omeprazole (PRILOSEC) 40 MG capsule Take 40 mg by mouth daily. (Patient not taking: Reported on 12/05/2021)     No current facility-administered medications for this visit.  Past Medical History:  Diagnosis Date   Aortic atherosclerosis (HCC)    Atrial fibrillation ([place])    Esophageal stricture    Multiple dilations   GERD (gastroesophageal reflux disease)    Hyperlipidemia    Hypertension     Past Surgical History:  Procedure Laterality Date   Lumbar luminectomy  2007   Dr. Ashok Pall   Partial finger amputation      Family History  Problem Relation Age of Onset   Heart failure Father        Died age 89   Heart attack Brother        Age 19   Aortic stenosis Brother        Bicuspid valve status post AVR    Allergies as of 01/24/2022   (No Known Allergies)     Social History   Socioeconomic History   Marital status: Married    Spouse name: Not on file   Number of children: Not on file   Years of education: Not on file   Highest education level: Not on file  Occupational History   Not on file  Tobacco Use   Smoking status: Former    Packs/day: 0.50    Years: 15.00    Total pack years: 7.50    Types: Cigarettes    Quit date: 12/30/2008    Years since quitting: 13.0    Passive exposure: Never   Smokeless tobacco: Current    Types: Snuff, Chew   Tobacco comments:    dips 2 cans/snuff per week  Substance and Sexual Activity   Alcohol use: Yes    Alcohol/week: 0.0 standard drinks of alcohol    Comment: rare beer   Drug use: No   Sexual activity: Not on file  Other Topics Concern   Not on file  Social History Narrative   Lives in [place]   Works as a Freight forwarder at a [employer].   Social Determinants of Health   Financial Resource Strain: Not on file  Food Insecurity: Not on file  Transportation Needs: Not on file  Physical Activity: Not on file  Stress: Not on file  Social Connections: Not on file     Review of Systems   Gen: Denies fever, chills, anorexia. Denies fatigue, weakness, weight loss.  CV: Denies chest pain, palpitations, syncope, peripheral edema, and claudication. Resp: Denies dyspnea at rest, cough, wheezing, coughing up blood, and pleurisy. GI: See HPI Derm: Denies rash, itching, dry skin Psych: Denies depression, anxiety, memory loss, confusion. No homicidal or suicidal ideation.  Heme: Denies bruising, bleeding, and enlarged lymph nodes.   Physical Exam   BP 130/82 (BP Location: Left Arm, Patient Position: Sitting, Cuff Size: Large)   Pulse 60   Temp (!) 97 F (36.1 C) (Temporal)   Ht 5\' 11"  (1.803 m)   Wt 202 lb (91.6 kg)   SpO2 99%   BMI 28.17 kg/m   General:   Alert and oriented. No distress noted. Pleasant and cooperative.  Head:  Normocephalic and atraumatic. Eyes:  Conjuctiva clear  without scleral icterus. Mouth:  Oral mucosa pink and moist. Good dentition. No lesions. Lungs:  Clear to auscultation bilaterally. No wheezes, rales, or rhonchi. No distress.  Heart:  S1, S2 present without murmurs appreciated.  Abdomen:  +BS, soft, non-tender and non-distended. No rebound or guarding. No HSM or masses noted. Rectal: deferred Msk:  Symmetrical without gross deformities. Normal posture. Extremities:  Without edema. Neurologic:  Alert and  oriented x4 Psych:  Alert and cooperative. Normal mood and affect.   Assessment  Wesley [name] is a 61 y.o. male with a history of GERD, A-fib on Eliquis, aortic atherosclerosis, HLD, HTN, history of esophageal strictures with history of multiple dilations, and food bolus in 2006 presenting today to discuss scheduling screening colonoscopy.   Screening for colon cancer: Never had colonoscopy before.  Previously was unable to be reached to schedule colonoscopy after his last office visit in 2021. Currently not having any alarm symptoms. No melena or BRBPR. Will proceed with TCS in the near future.   GERD, remote history of dysphagia and food bolus: Symptoms were well controlled in 2021 on omeprazole 40 mg daily.  He is now maintained on pantoprazole 40 mg daily given worsening epigastric pain that occurred one month ago and has since improved. Denies NSAID use, admits to rare alcohol use, and is former smoker. Denies any nausea, vomiting, lack appetite, early satiety, or dysphagia. Given patient's chronic reflux and ethnicity, he is at an increased risk for Barrett's esophagus.  We will proceed with EGD for screening for Barrett's esophagus as well as to further evaluate his reflux.   PLAN   Proceed with EGD and colonoscopy with propofol by Dr. Marletta Lor  in near future: the risks, benefits, and alternatives have been discussed with the patient in detail. The patient states understanding and desires to proceed.  ASA 3 Cardiology clearance to hold  Eliquis for 2 days prior to procedure Continue pantoprazole 40 mg daily.  GERD diet/lifestyle modifications.  Follow up as needed.     Brooke Bonito, MSN, FNP-BC, AGACNP-BC [facility]

## 2022-01-24 ENCOUNTER — Ambulatory Visit: Payer: BC Managed Care – PPO | Admitting: Gastroenterology

## 2022-01-24 ENCOUNTER — Encounter: Payer: Self-pay | Admitting: Gastroenterology

## 2022-01-24 ENCOUNTER — Telehealth: Payer: Self-pay | Admitting: *Deleted

## 2022-01-24 VITALS — BP 130/82 | HR 60 | Temp 97.0°F | Ht 71.0 in | Wt 202.0 lb

## 2022-01-24 DIAGNOSIS — R1310 Dysphagia, unspecified: Secondary | ICD-10-CM | POA: Diagnosis not present

## 2022-01-24 DIAGNOSIS — Z1211 Encounter for screening for malignant neoplasm of colon: Secondary | ICD-10-CM

## 2022-01-24 DIAGNOSIS — K219 Gastro-esophageal reflux disease without esophagitis: Secondary | ICD-10-CM | POA: Diagnosis not present

## 2022-01-24 NOTE — Patient Instructions (Signed)
We are scheduling you for an upper endoscopy and colonoscopy in the near future with Dr. Abbey Chatters.  We are requesting clearance cardiology to hold your Eliquis for 2 days prior to procedure.  Our schedulers to be in contact with you to schedule once we have received that clearance.  Continue taking your pantoprazole 40 mg daily.  Follow a GERD diet:  Avoid fried, fatty, greasy, spicy, citrus foods. Avoid caffeine and carbonated beverages. Avoid chocolate. Try eating 4-6 small meals a day rather than 3 large meals. Do not eat within 3 hours of laying down. Prop head of bed up on wood or bricks to create a 6 inch incline.  It was a pleasure to see you today. I want to create trusting relationships with patients. If you receive a survey regarding your visit,  I greatly appreciate you taking time to fill this out on paper or through your MyChart. I value your feedback.  Venetia Night, MSN, FNP-BC, AGACNP-BC [facility]

## 2022-01-24 NOTE — Telephone Encounter (Signed)
   Patient Name: Wesley [name]  DOB: Jul 01, 1960 MRN: [id number]  Primary Cardiologist: Rozann Lesches, MD  Chart reviewed as part of pre-operative protocol coverage. Pharmacy clearance only. Per office protocols and pharmacist review  [facility] may hold Eliquis 2 days prior to planned procedure.     I will route this recommendation to the requesting party via Epic fax function and remove from pre-op pool.  Please call with questions.  Loel Dubonnet, NP 01/24/2022, 3:40 PM

## 2022-01-24 NOTE — Telephone Encounter (Signed)
  Request for patient to stop medication prior to procedure or is needing cleareance  01/24/22  Wesley [name] [birth year]  What type of surgery is being performed? Colonoscopy/EGD  When is surgery scheduled? TBD  What type of clearance is required (medical or pharmacy to hold medication or both? Hold medication  Are there any medications that need to be held prior to surgery and how long? Eliquis x 2 days  Name of physician performing surgery?  [facility] Phone: [phone] Fax: [phone]  Anethesia type (none, local, MAC, general)? MAC

## 2022-01-24 NOTE — Telephone Encounter (Signed)
Patient with diagnosis of afib on Eliquis for anticoagulation.    Procedure: Colonoscopy/EGD Date of procedure: TBD   CHA2DS2-VASc Score = 2   This indicates a 2.2% annual risk of stroke. The patient's score is based upon: CHF History: 0 HTN History: 1 Diabetes History: 0 Stroke History: 0 Vascular Disease History: 1 (aortic atherosclerosis by CT) Age Score: 0 Gender Score: 0      CrCl 58 ml/min Platelet count 238  Per office protocol, patient can hold Eliquis for 2 days prior to procedure.    **This guidance is not considered finalized until pre-operative APP has relayed final recommendations.**

## 2022-01-24 NOTE — Telephone Encounter (Signed)
Mr. Salm is on Eliquis for atrial fibrillation. Will route to pharmacy team for input.    CHA2DS2-VASc Score = 2   This indicates a 2.2% annual risk of stroke. The patient's score is based upon: CHF History: 0 HTN History: 1 Diabetes History: 0 Stroke History: 0 Vascular Disease History: 1 (aortic atherosclerosis by CT) Age Score: 0 Gender Score: 0     Platelet count: 238 (12/14/21)  Creatinine Clearance: 71 mL/min (based on original formula) or 64 mL/min (adjusted for weight) [based on creatinine 1.41 labs 12/14/21)  Loel Dubonnet, NP

## 2022-01-25 NOTE — Telephone Encounter (Signed)
LMTRC

## 2022-01-29 NOTE — Telephone Encounter (Signed)
Pt says he needs a Monday for the procedures. Advised pt will call back in December once get providers schedule for that month.

## 2022-02-01 NOTE — Telephone Encounter (Signed)
LMTRC

## 2022-02-05 ENCOUNTER — Encounter: Payer: Self-pay | Admitting: *Deleted

## 2022-02-05 MED ORDER — PEG 3350-KCL-NA BICARB-NACL 420 G PO SOLR: 4000.0000 mL | Freq: Once | ORAL | 0 refills | Status: AC

## 2022-02-05 NOTE — Telephone Encounter (Signed)
Pt has been scheduled for 03/04/22 at 8am. Instructions and pre-op date mailed to pt. Prep sent to the pharmacy.

## 2022-02-16 ENCOUNTER — Other Ambulatory Visit: Payer: Self-pay | Admitting: Cardiology

## 2022-02-18 NOTE — Telephone Encounter (Signed)
Prescription refill request for Eliquis received. Indication: PAF Last office visit: 12/05/21  Ival Bible MD Scr: 1.41 on 12/14/21 Age: 61 Weight: 91.4kg  Based on above findings Eliquis 5mg  twice daily is the appropriate dose.  Refill approved.

## 2022-02-28 ENCOUNTER — Encounter (HOSPITAL_COMMUNITY): Payer: Self-pay

## 2022-02-28 ENCOUNTER — Encounter (HOSPITAL_COMMUNITY)
Admission: RE | Admit: 2022-02-28 | Discharge: 2022-02-28 | Disposition: A | Discharge status: Home / Self Care | Payer: BC Managed Care – PPO | Source: Ambulatory Visit | Attending: Internal Medicine | Admitting: Internal Medicine

## 2022-02-28 HISTORY — DX: Personal history of urinary calculi: Z87.442

## 2022-02-28 HISTORY — DX: Unspecified osteoarthritis, unspecified site: M19.90

## 2022-02-28 HISTORY — DX: Sleep apnea, unspecified: G47.30

## 2022-02-28 NOTE — Patient Instructions (Signed)
Wesley [name]  02/28/2022     @PREFPERIOPPHARMACY @   Your procedure is scheduled on  03/04/2022.   Report to 14/06/2021 at  0630  A.M.   Call this number if you have problems the morning of surgery:  [phone]  If you experience any cold or flu symptoms such as cough, fever, chills, shortness of breath, etc. between now and your scheduled surgery, please notify [phone] at the above number.   Remember:  Follow the diet and prep instructions given to you by the office.       Your last dose of eliquis should be on 03/01/2022.     Take these medicines the morning of surgery with A SIP OF WATER       diltiazem, flecanide, nebivolol, protonix, flomax.    Do not wear jewelry, make-up or nail polish.  Do not wear lotions, powders, or perfumes, or deodorant.  Do not shave 48 hours prior to surgery.  Men may shave face and neck.  Do not bring valuables to the hospital.  [facility] is not responsible for any belongings or valuables.  Contacts, dentures or bridgework may not be worn into surgery.  Leave your suitcase in the car.  After surgery it may be brought to your room.  For patients admitted to the hospital, discharge time will be determined by your treatment team.  Patients discharged the day of surgery will not be allowed to drive home and must have someone with them for 24 hours.    Special instructions:   DO NOT smoke tobacco or vape for 24 hours before your procedure.  Please read over the following fact sheets that you were given. Anesthesia Post-op Instructions and Care and Recovery After Surgery      Upper Endoscopy, Adult, Care After After the procedure, it is common to have a sore throat. It is also common to have: Mild stomach pain or discomfort. Bloating. Nausea. Follow these instructions at home: The instructions below may help you care for yourself at home. Your health care provider may give you more instructions. If you have questions, ask your  health care provider. If you were given a sedative during the procedure, it can affect you for several hours. Do not drive or operate machinery until your health care provider says that it is safe. If you will be going home right after the procedure, plan to have a responsible adult: Take you home from the hospital or clinic. You will not be allowed to drive. Care for you for the time you are told. Follow instructions from your health care provider about what you may eat and drink. Return to your normal activities as told by your health care provider. Ask your health care provider what activities are safe for you. Take over-the-counter and prescription medicines only as told by your health care provider. Contact a health care provider if you: Have a sore throat that lasts longer than one day. Have trouble swallowing. Have a fever. Get help right away if you: Vomit blood or your vomit looks like coffee grounds. Have bloody, black, or tarry stools. Have a very bad sore throat or you cannot swallow. Have difficulty breathing or very bad pain in your chest or abdomen. These symptoms may be an emergency. Get help right away. Call 911. Do not wait to see if the symptoms will go away. Do not drive yourself to the hospital. Summary After the procedure, it is common to  have a sore throat, mild stomach discomfort, bloating, and nausea. If you were given a sedative during the procedure, it can affect you for several hours. Do not drive until your health care provider says that it is safe. Follow instructions from your health care provider about what you may eat and drink. Return to your normal activities as told by your health care provider. This information is not intended to replace advice given to you by your health care provider. Make sure you discuss any questions you have with your health care provider. Document Revised: 06/27/2021 Document Reviewed: 06/27/2021 Elsevier Patient Education  [place]. Colonoscopy, Adult, Care After The following information offers guidance on how to care for yourself after your procedure. Your health care provider may also give you more specific instructions. If you have problems or questions, contact your health care provider. What can I expect after the procedure? After the procedure, it is common to have: A small amount of blood in your stool for 24 hours after the procedure. Some gas. Mild cramping or bloating of your abdomen. Follow these instructions at home: Eating and drinking  Drink enough fluid to keep your urine pale yellow. Follow instructions from your health care provider about eating or drinking restrictions. Resume your normal diet as told by your health care provider. Avoid heavy or fried foods that are hard to digest. Activity Rest as told by your health care provider. Avoid sitting for a long time without moving. Get up to take short walks every 1-2 hours. This is important to improve blood flow and breathing. Ask for help if you feel weak or unsteady. Return to your normal activities as told by your health care provider. Ask your health care provider what activities are safe for you. Managing cramping and bloating  Try walking around when you have cramps or feel bloated. If directed, apply heat to your abdomen as told by your health care provider. Use the heat source that your health care provider recommends, such as a moist heat pack or a heating pad. Place a towel between your skin and the heat source. Leave the heat on for 20-30 minutes. Remove the heat if your skin turns bright red. This is especially important if you are unable to feel pain, heat, or cold. You have a greater risk of getting burned. General instructions If you were given a sedative during the procedure, it can affect you for several hours. Do not drive or operate machinery until your health care provider says that it is safe. For the first 24 hours  after the procedure: Do not sign important documents. Do not drink alcohol. Do your regular daily activities at a slower pace than normal. Eat soft foods that are easy to digest. Take over-the-counter and prescription medicines only as told by your health care provider. Keep all follow-up visits. This is important. Contact a health care provider if: You have blood in your stool 2-3 days after the procedure. Get help right away if: You have more than a small spotting of blood in your stool. You have large blood clots in your stool. You have swelling of your abdomen. You have nausea or vomiting. You have a fever. You have increasing pain in your abdomen that is not relieved with medicine. These symptoms may be an emergency. Get help right away. Call 911. Do not wait to see if the symptoms will go away. Do not drive yourself to the hospital. Summary After the procedure, it is common to  have a small amount of blood in your stool. You may also have mild cramping and bloating of your abdomen. If you were given a sedative during the procedure, it can affect you for several hours. Do not drive or operate machinery until your health care provider says that it is safe. Get help right away if you have a lot of blood in your stool, nausea or vomiting, a fever, or increased pain in your abdomen. This information is not intended to replace advice given to you by your health care provider. Make sure you discuss any questions you have with your health care provider. Document Revised: 11/08/2020 Document Reviewed: 11/08/2020 Elsevier Patient Education  [place] After The following information offers guidance on how to care for yourself after your procedure. Your health care provider may also give you more specific instructions. If you have problems or questions, contact your health care provider. What can I expect after the procedure? After the procedure, it is  common to have: Tiredness. Little or no memory about what happened during or after the procedure. Impaired judgment when it comes to making decisions. Nausea or vomiting. Some trouble with balance. Follow these instructions at home: For the time period you were told by your health care provider:  Rest. Do not participate in activities where you could fall or become injured. Do not drive or use machinery. Do not drink alcohol. Do not take sleeping pills or medicines that cause drowsiness. Do not make important decisions or sign legal documents. Do not take care of children on your own. Medicines Take over-the-counter and prescription medicines only as told by your health care provider. If you were prescribed antibiotics, take them as told by your health care provider. Do not stop using the antibiotic even if you start to feel better. Eating and drinking Follow instructions from your health care provider about what you may eat and drink. Drink enough fluid to keep your urine pale yellow. If you vomit: Drink clear fluids slowly and in small amounts as you are able. Clear fluids include water, ice chips, low-calorie sports drinks, and fruit juice that has water added to it (diluted fruit juice). Eat light and bland foods in small amounts as you are able. These foods include bananas, applesauce, rice, lean meats, toast, and crackers. General instructions  Have a responsible adult stay with you for the time you are told. It is important to have someone help care for you until you are awake and alert. If you have sleep apnea, surgery and some medicines can increase your risk for breathing problems. Follow instructions from your health care provider about wearing your sleep device: When you are sleeping. This includes during daytime naps. While taking prescription pain medicines, sleeping medicines, or medicines that make you drowsy. Do not use any products that contain nicotine or tobacco.  These products include cigarettes, chewing tobacco, and vaping devices, such as e-cigarettes. If you need help quitting, ask your health care provider. Contact a health care provider if: You feel nauseous or vomit every time you eat or drink. You feel light-headed. You are still sleepy or having trouble with balance after 24 hours. You get a rash. You have a fever. You have redness or swelling around the IV site. Get help right away if: You have trouble breathing. You have new confusion after you get home. These symptoms may be an emergency. Get help right away. Call 911. Do not wait to see if the symptoms  will go away. Do not drive yourself to the hospital. This information is not intended to replace advice given to you by your health care provider. Make sure you discuss any questions you have with your health care provider. Document Revised: 08/13/2021 Document Reviewed: 08/13/2021 Elsevier Patient Education  [place].

## 2022-02-28 NOTE — Pre-Procedure Instructions (Signed)
Attempted pre-op phone call. Left VM for him to call us back. 

## 2022-03-04 ENCOUNTER — Encounter (HOSPITAL_COMMUNITY)
Admission: RE | Disposition: A | Discharge status: Home / Self Care | Payer: Self-pay | Source: Home / Self Care | Attending: Internal Medicine

## 2022-03-04 ENCOUNTER — Ambulatory Visit (HOSPITAL_COMMUNITY): Payer: BC Managed Care – PPO | Admitting: Anesthesiology

## 2022-03-04 ENCOUNTER — Ambulatory Visit (HOSPITAL_COMMUNITY)
Admission: RE | Admit: 2022-03-04 | Discharge: 2022-03-04 | Disposition: A | Discharge status: Home / Self Care | Payer: BC Managed Care – PPO | Attending: Internal Medicine | Admitting: Internal Medicine

## 2022-03-04 ENCOUNTER — Other Ambulatory Visit: Payer: Self-pay

## 2022-03-04 ENCOUNTER — Encounter (HOSPITAL_COMMUNITY): Payer: Self-pay

## 2022-03-04 DIAGNOSIS — Z1211 Encounter for screening for malignant neoplasm of colon: Secondary | ICD-10-CM | POA: Insufficient documentation

## 2022-03-04 DIAGNOSIS — I10 Essential (primary) hypertension: Secondary | ICD-10-CM | POA: Diagnosis not present

## 2022-03-04 DIAGNOSIS — K449 Diaphragmatic hernia without obstruction or gangrene: Secondary | ICD-10-CM | POA: Insufficient documentation

## 2022-03-04 DIAGNOSIS — Z13810 Encounter for screening for upper gastrointestinal disorder: Secondary | ICD-10-CM | POA: Insufficient documentation

## 2022-03-04 DIAGNOSIS — I4891 Unspecified atrial fibrillation: Secondary | ICD-10-CM | POA: Insufficient documentation

## 2022-03-04 DIAGNOSIS — K2289 Other specified disease of esophagus: Secondary | ICD-10-CM

## 2022-03-04 DIAGNOSIS — K222 Esophageal obstruction: Secondary | ICD-10-CM | POA: Insufficient documentation

## 2022-03-04 DIAGNOSIS — Z87891 Personal history of nicotine dependence: Secondary | ICD-10-CM | POA: Diagnosis not present

## 2022-03-04 DIAGNOSIS — Z79899 Other long term (current) drug therapy: Secondary | ICD-10-CM | POA: Insufficient documentation

## 2022-03-04 DIAGNOSIS — G4730 Sleep apnea, unspecified: Secondary | ICD-10-CM | POA: Diagnosis not present

## 2022-03-04 DIAGNOSIS — K5730 Diverticulosis of large intestine without perforation or abscess without bleeding: Secondary | ICD-10-CM | POA: Diagnosis not present

## 2022-03-04 DIAGNOSIS — Z1212 Encounter for screening for malignant neoplasm of rectum: Secondary | ICD-10-CM

## 2022-03-04 DIAGNOSIS — K219 Gastro-esophageal reflux disease without esophagitis: Secondary | ICD-10-CM | POA: Diagnosis not present

## 2022-03-04 DIAGNOSIS — K635 Polyp of colon: Secondary | ICD-10-CM | POA: Diagnosis not present

## 2022-03-04 HISTORY — PX: BIOPSY: SHX5522

## 2022-03-04 HISTORY — PX: COLONOSCOPY WITH PROPOFOL: SHX5780

## 2022-03-04 HISTORY — PX: POLYPECTOMY: SHX5525

## 2022-03-04 HISTORY — PX: ESOPHAGOGASTRODUODENOSCOPY (EGD) WITH PROPOFOL: SHX5813

## 2022-03-04 SURGERY — COLONOSCOPY WITH PROPOFOL
Anesthesia: General

## 2022-03-04 MED ORDER — PROPOFOL 500 MG/50ML IV EMUL
INTRAVENOUS | Status: DC | PRN
  Administered 2022-03-04: 200 ug/kg/min via INTRAVENOUS

## 2022-03-04 MED ORDER — PHENYLEPHRINE 80 MCG/ML (10ML) SYRINGE FOR IV PUSH (FOR BLOOD PRESSURE SUPPORT)
PREFILLED_SYRINGE | INTRAVENOUS | Status: DC | PRN
  Administered 2022-03-04: 80 ug via INTRAVENOUS
  Administered 2022-03-04: 160 ug via INTRAVENOUS

## 2022-03-04 MED ORDER — PROPOFOL 10 MG/ML IV BOLUS
INTRAVENOUS | Status: DC | PRN
  Administered 2022-03-04: 100 mg via INTRAVENOUS

## 2022-03-04 MED ORDER — LACTATED RINGERS IV SOLN: INTRAVENOUS | Status: DC

## 2022-03-04 MED ORDER — LIDOCAINE HCL (CARDIAC) PF 100 MG/5ML IV SOSY
PREFILLED_SYRINGE | INTRAVENOUS | Status: DC | PRN
  Administered 2022-03-04: 50 mg via INTRATRACHEAL

## 2022-03-04 NOTE — Discharge Instructions (Signed)
EGD Discharge instructions Please read the instructions outlined below and refer to this sheet in the next few weeks. These discharge instructions provide you with general information on caring for yourself after you leave the hospital. Your doctor may also give you specific instructions. While your treatment has been planned according to the most current medical practices available, unavoidable complications occasionally occur. If you have any problems or questions after discharge, please call your doctor. ACTIVITY You may resume your regular activity but move at a slower pace for the next 24 hours.  Take frequent rest periods for the next 24 hours.  Walking will help expel (get rid of) the air and reduce the bloated feeling in your abdomen.  No driving for 24 hours (because of the anesthesia (medicine) used during the test).  You may shower.  Do not sign any important legal documents or operate any machinery for 24 hours (because of the anesthesia used during the test).  NUTRITION Drink plenty of fluids.  You may resume your normal diet.  Begin with a light meal and progress to your normal diet.  Avoid alcoholic beverages for 24 hours or as instructed by your caregiver.  MEDICATIONS You may resume your normal medications unless your caregiver tells you otherwise.  WHAT YOU CAN EXPECT TODAY You may experience abdominal discomfort such as a feeling of fullness or "gas" pains.  FOLLOW-UP Your doctor will discuss the results of your test with you.  SEEK IMMEDIATE MEDICAL ATTENTION IF ANY OF THE FOLLOWING OCCUR: Excessive nausea (feeling sick to your stomach) and/or vomiting.  Severe abdominal pain and distention (swelling).  Trouble swallowing.  Temperature over 101 F (37.8 C).  Rectal bleeding or vomiting of blood.    Colonoscopy Discharge Instructions  Read the instructions outlined below and refer to this sheet in the next few weeks. These discharge instructions provide you with  general information on caring for yourself after you leave the hospital. Your doctor may also give you specific instructions. While your treatment has been planned according to the most current medical practices available, unavoidable complications occasionally occur.   ACTIVITY You may resume your regular activity, but move at a slower pace for the next 24 hours.  Take frequent rest periods for the next 24 hours.  Walking will help get rid of the air and reduce the bloated feeling in your belly (abdomen).  No driving for 24 hours (because of the medicine (anesthesia) used during the test).   Do not sign any important legal documents or operate any machinery for 24 hours (because of the anesthesia used during the test).  NUTRITION Drink plenty of fluids.  You may resume your normal diet as instructed by your doctor.  Begin with a light meal and progress to your normal diet. Heavy or fried foods are harder to digest and may make you feel sick to your stomach (nauseated).  Avoid alcoholic beverages for 24 hours or as instructed.  MEDICATIONS You may resume your normal medications unless your doctor tells you otherwise.  WHAT YOU CAN EXPECT TODAY Some feelings of bloating in the abdomen.  Passage of more gas than usual.  Spotting of blood in your stool or on the toilet paper.  IF YOU HAD POLYPS REMOVED DURING THE COLONOSCOPY: No aspirin products for 7 days or as instructed.  No alcohol for 7 days or as instructed.  Eat a soft diet for the next 24 hours.  FINDING OUT THE RESULTS OF YOUR TEST Not all test results are available  during your visit. If your test results are not back during the visit, make an appointment with your caregiver to find out the results. Do not assume everything is normal if you have not heard from your caregiver or the medical facility. It is important for you to follow up on all of your test results.  SEEK IMMEDIATE MEDICAL ATTENTION IF: You have more than a spotting of  blood in your stool.  Your belly is swollen (abdominal distention).  You are nauseated or vomiting.  You have a temperature over 101.  You have abdominal pain or discomfort that is severe or gets worse throughout the day.   Your upper endoscopy revealed small hiatal hernia, nonobstructive Schatzki's ring.  I did take samples of your esophagus to rule out Barrett's esophagus.  Stomach and small bowel appeared normal.  Continue on pantoprazole daily.  Your colonoscopy revealed 1 polyp(s) which I removed successfully. Await pathology results, my office will contact you. I recommend repeating colonoscopy in 5-10 years for surveillance purposes.   You also have diverticulosis. I would recommend increasing fiber in your diet or adding OTC Benefiber/Metamucil. Be sure to drink at least 4 to 6 glasses of water daily. Follow-up with GI in 3-4 months  I hope you have a great rest of your week!  Hennie Duos. Marletta Lor, D.O. Gastroenterology and Hepatology [facility]

## 2022-03-04 NOTE — H&P (Signed)
Primary Care Physician:  Benita Stabile, MD Primary Gastroenterologist:  Dr. Marletta Lor  Pre-Procedure History & Physical: HPI:  Wesley [name] is a 61 y.o. male is here for an EGD for chronic GERD, screening for Barrett's and a colonoscopy to be performed for colon cancer screening purposes.  Past Medical History:  Diagnosis Date   Aortic atherosclerosis (HCC)    Arthritis    Atrial fibrillation (HCC)    Esophageal stricture    Multiple dilations   GERD (gastroesophageal reflux disease)    History of kidney stones    Hyperlipidemia    Hypertension    Sleep apnea     Past Surgical History:  Procedure Laterality Date   Lumbar luminectomy  2007   Dr. Coletta Memos   Partial finger amputation      Prior to Admission medications   Medication Sig Start Date End Date Taking? Authorizing Provider  diltiazem (CARDIZEM) 30 MG tablet Take 1 tablet (30 mg total) by mouth every 6 (six) hours as needed. PALPITATIONS 01/30/18  Yes Devoria Albe, MD  ezetimibe (ZETIA) 10 MG tablet Take 1 tablet (10 mg total) by mouth daily. 01/15/22  Yes Jonelle Sidle, MD  flecainide (TAMBOCOR) 100 MG tablet TAKE 1 TABLET BY MOUTH TWICE A DAY 11/15/21  Yes Jonelle Sidle, MD  nebivolol (BYSTOLIC) 10 MG tablet Take 10 mg by mouth daily.   Yes [provider]  pantoprazole (PROTONIX) 40 MG tablet Take 1 tablet by mouth daily.   Yes [provider]  tamsulosin (FLOMAX) 0.4 MG CAPS capsule Take 1 tablet by mouth daily. 12/21/21  Yes [provider]  apixaban (ELIQUIS) 5 MG TABS tablet TAKE 1 TABLET BY MOUTH TWICE A DAY 02/18/22   Jonelle Sidle, MD    Allergies as of 02/05/2022   (No Known Allergies)    Family History  Problem Relation Age of Onset   Heart failure Father        Died age 76   Prostate cancer Father 67 - 57   Heart attack Brother        Age 35   Aortic stenosis Brother        Bicuspid valve status post AVR    Social History   Socioeconomic History   Marital  status: Married    Spouse name: Not on file   Number of children: Not on file   Years of education: Not on file   Highest education level: Not on file  Occupational History   Not on file  Tobacco Use   Smoking status: Former    Packs/day: 0.50    Years: 15.00    Total pack years: 7.50    Types: Cigarettes    Quit date: 12/30/2008    Years since quitting: 13.1    Passive exposure: Never   Smokeless tobacco: Current    Types: Snuff, Chew   Tobacco comments:    dips 2 cans/snuff per week  Substance and Sexual Activity   Alcohol use: Yes    Alcohol/week: 0.0 standard drinks of alcohol    Comment: rare beer   Drug use: No   Sexual activity: Not on file  Other Topics Concern   Not on file  Social History Narrative   Lives in [place]   Works as a Production designer, theatre/television/film at a [employer].   Social Determinants of Health   Financial Resource Strain: Not on file  Food Insecurity: Not on file  Transportation Needs: Not on file  Physical Activity:  Not on file  Stress: Not on file  Social Connections: Not on file  Intimate Partner Violence: Not on file    Review of Systems: See HPI, otherwise negative ROS  Physical Exam: Vital signs in last 24 hours: Temp:  [98.3 F (36.8 C)] 98.3 F (36.8 C) (12/04 0646) Pulse Rate:  [64] 64 (12/04 0646) Resp:  [15] 15 (12/04 0646) BP: (140)/(78) 140/78 (12/04 0646) SpO2:  [95 %] 95 % (12/04 0646)   General:   Alert,  Well-developed, well-nourished, pleasant and cooperative in NAD Head:  Normocephalic and atraumatic. Eyes:  Sclera clear, no icterus.   Conjunctiva pink. Ears:  Normal auditory acuity. Nose:  No deformity, discharge,  or lesions. Msk:  Symmetrical without gross deformities. Normal posture. Extremities:  Without clubbing or edema. Neurologic:  Alert and  oriented x4;  grossly normal neurologically. Skin:  Intact without significant lesions or rashes. Psych:  Alert and cooperative. Normal mood and affect.  Impression/Plan: Wesley [name] is here for an EGD for chronic GERD, screening for Barrett's and a colonoscopy to be performed for colon cancer screening purposes.  The risks of the procedure including infection, bleed, or perforation as well as benefits, limitations, alternatives and imponderables have been reviewed with the patient. Questions have been answered. All parties agreeable.

## 2022-03-04 NOTE — Transfer of Care (Signed)
Immediate Anesthesia Transfer of Care Note  Patient: Wesley [name]  Procedure(s) Performed: COLONOSCOPY WITH PROPOFOL ESOPHAGOGASTRODUODENOSCOPY (EGD) WITH PROPOFOL BIOPSY POLYPECTOMY  Patient Location: Short Stay  Anesthesia Type:General  Level of Consciousness: awake  Airway & Oxygen Therapy: Patient Spontanous Breathing  Post-op Assessment: Report given to RN and Post -op Vital signs reviewed and stable  Post vital signs: Reviewed and stable  Last Vitals:  Vitals Value Taken Time  BP 91/67 03/04/22 0840  Temp 36.4 C 03/04/22 0840  Pulse 56 03/04/22 0840  Resp 11 03/04/22 0840  SpO2 92 % 03/04/22 0840    Last Pain:  Vitals:   03/04/22 0840  TempSrc: Oral  PainSc: 0-No pain         Complications: No notable events documented.

## 2022-03-04 NOTE — Anesthesia Preprocedure Evaluation (Signed)
Anesthesia Evaluation  Patient identified by MRN, date of birth, ID band Patient awake    Reviewed: Allergy & Precautions, H&P , NPO status , Patient's Chart, lab work & pertinent test results, reviewed documented beta blocker date and time   Airway Mallampati: II  TM Distance: >3 FB Neck ROM: Full    Dental  (+) Dental Advisory Given, Edentulous Upper, Missing, Poor Dentition, Chipped   Pulmonary sleep apnea , former smoker   Pulmonary exam normal breath sounds clear to auscultation       Cardiovascular Exercise Tolerance: Good hypertension, Pt. on medications and Pt. on home beta blockers Normal cardiovascular exam+ dysrhythmias Atrial Fibrillation + Valvular Problems/Murmurs  Rhythm:Regular Rate:Normal     Neuro/Psych negative neurological ROS  negative psych ROS   GI/Hepatic Neg liver ROS,GERD  Medicated and Controlled,,  Endo/Other  negative endocrine ROS    Renal/GU negative Renal ROS  negative genitourinary   Musculoskeletal  (+) Arthritis , Osteoarthritis,    Abdominal   Peds negative pediatric ROS (+)  Hematology negative hematology ROS (+)   Anesthesia Other Findings   Reproductive/Obstetrics negative OB ROS                             Anesthesia Physical Anesthesia Plan  ASA: 3  Anesthesia Plan: General   Post-op Pain Management: Minimal or no pain anticipated   Induction: Intravenous  PONV Risk Score and Plan: 1 and Propofol infusion  Airway Management Planned: Nasal Cannula and Natural Airway  Additional Equipment:   Intra-op Plan:   Post-operative Plan:   Informed Consent: I have reviewed the patients History and Physical, chart, labs and discussed the procedure including the risks, benefits and alternatives for the proposed anesthesia with the patient or authorized representative who has indicated his/her understanding and acceptance.     Dental advisory  given  Plan Discussed with: CRNA and Surgeon  Anesthesia Plan Comments:         Anesthesia Quick Evaluation

## 2022-03-04 NOTE — Op Note (Signed)
[facility] Patient Name: Wesley [name] Procedure Date: 03/04/2022 8:20 AM MRN: [id number] Date of Birth: 1960-06-24 Attending MD: Elon Alas. Abbey Chatters , [place], [id number] CSN: [id number] Age: 61 Admit Type: Outpatient Procedure:                Colonoscopy Indications:              Screening for colorectal malignant neoplasm Providers:                Elon Alas. Abbey Chatters, DO, Hughie Closs RN, RN,                            Suzan Garibaldi. Risa Grill, Technician Referring MD:              Medicines:                See the Anesthesia note for documentation of the                            administered medications Complications:            No immediate complications. Estimated Blood Loss:     Estimated blood loss was minimal. Procedure:                Pre-Anesthesia Assessment:                           - The anesthesia plan was to use monitored                            anesthesia care (MAC).                           After obtaining informed consent, the colonoscope                            was passed under direct vision. Throughout the                            procedure, the patient's blood pressure, pulse, and                            oxygen saturations were monitored continuously. The                            [id number]-HQ190L ([id number]) scope was introduced through                            the anus and advanced to the the cecum, identified                            by appendiceal orifice and ileocecal valve. The                            colonoscopy was performed without difficulty. The                            patient tolerated  the procedure well. The quality                            of the bowel preparation was evaluated using the                            BBPS [facility] Bowel Preparation Scale) with scores                            of: Right Colon = 3, Transverse Colon = 3 and Left                            Colon = 3 (entire mucosa seen well with no residual                             staining, small fragments of stool or opaque                            liquid). The total BBPS score equals 9. Scope In: 8:21:38 AM Scope Out: 8:35:59 AM Scope Withdrawal Time: 0 hours 9 minutes 55 seconds  Total Procedure Duration: 0 hours 14 minutes 21 seconds  Findings:      The perianal and digital rectal examinations were normal.      A few medium-mouthed diverticula were found in the sigmoid colon.      A 2 mm polyp was found in the transverse colon. The polyp was sessile.       The polyp was removed with a cold biopsy forceps. Resection and       retrieval were complete.      The exam was otherwise without abnormality. Impression:               - Diverticulosis in the sigmoid colon.                           - One 2 mm polyp in the transverse colon, removed                            with a cold biopsy forceps. Resected and retrieved.                           - The examination was otherwise normal. Moderate Sedation:      Per Anesthesia Care Recommendation:           - Patient has a contact number available for                            emergencies. The signs and symptoms of potential                            delayed complications were discussed with the                            patient. Return to normal activities tomorrow.  Written discharge instructions were provided to the                            patient.                           - Resume previous diet.                           - Continue present medications.                           - Await pathology results.                           - Repeat colonoscopy in 5-10 years for surveillance.                           - Return to GI clinic in 3 months. Procedure Code(s):        --- Professional ---                           [phone], Colonoscopy, flexible; with biopsy, single                            or multiple Diagnosis Code(s):        --- Professional ---                            Z12.11, Encounter for screening for malignant                            neoplasm of colon                           D12.3, Benign neoplasm of transverse colon (hepatic                            flexure or splenic flexure)                           K57.30, Diverticulosis of large intestine without                            perforation or abscess without bleeding CPT copyright 2022 American Medical Association. All rights reserved. The codes documented in this report are preliminary and upon coder review may  be revised to meet current compliance requirements. Elon Alas. Abbey Chatters, DO [place] Abbey Chatters, DO 03/04/2022 8:38:12 AM This report has been signed electronically. Number of Addenda: 0

## 2022-03-04 NOTE — Anesthesia Postprocedure Evaluation (Signed)
Anesthesia Post Note  Patient: ERDEM NAAS  Procedure(s) Performed: COLONOSCOPY WITH PROPOFOL ESOPHAGOGASTRODUODENOSCOPY (EGD) WITH PROPOFOL BIOPSY POLYPECTOMY  Patient location during evaluation: Phase II Anesthesia Type: General Level of consciousness: awake and alert and oriented Pain management: pain level controlled Vital Signs Assessment: post-procedure vital signs reviewed and stable Respiratory status: spontaneous breathing, nonlabored ventilation and respiratory function stable Cardiovascular status: blood pressure returned to baseline and stable Postop Assessment: no apparent nausea or vomiting Anesthetic complications: no  No notable events documented.   Last Vitals:  Vitals:   03/04/22 0646 03/04/22 0840  BP: (!) 140/78 91/67  Pulse: 64 (!) 56  Resp: 15 11  Temp: 36.8 C (!) 36.4 C  SpO2: 95% 92%    Last Pain:  Vitals:   03/04/22 0840  TempSrc: Oral  PainSc: 0-No pain                 Suriah Peragine C Madina Galati

## 2022-03-04 NOTE — Op Note (Signed)
[facility] Patient Name: Wesley [name] Procedure Date: 03/04/2022 8:00 AM MRN: [id number] Date of Birth: 1960/05/27 Attending MD: Elon Alas. Abbey Chatters , [place], [id number] CSN: [id number] Age: 60 Admit Type: Outpatient Procedure:                Upper GI endoscopy Indications:              Screening for Barrett's esophagus Providers:                Elon Alas. Abbey Chatters, DO, Hughie Closs RN, RN,                            Suzan Garibaldi. Risa Grill, Technician Referring MD:              Medicines:                See the Anesthesia note for documentation of the                            administered medications Complications:            No immediate complications. Estimated Blood Loss:     Estimated blood loss was minimal. Procedure:                Pre-Anesthesia Assessment:                           - The anesthesia plan was to use monitored                            anesthesia care (MAC).                           After obtaining informed consent, the endoscope was                            passed under direct vision. Throughout the                            procedure, the patient's blood pressure, pulse, and                            oxygen saturations were monitored continuously. The                            [id number]-H190 ([id number]) scope was introduced through the                            mouth, and advanced to the second part of duodenum.                            The upper GI endoscopy was accomplished without                            difficulty. The patient tolerated the procedure  well. Scope In: 8:13:40 AM Scope Out: 8:16:53 AM Total Procedure Duration: 0 hours 3 minutes 13 seconds  Findings:      A mild Schatzki ring was found in the distal esophagus.      The Z-line was variable. Biopsies were taken with a cold forceps for       histology.      A small hiatal hernia was present.      The entire examined stomach was normal.      The duodenal bulb,  first portion of the duodenum and second portion of       the duodenum were normal. Impression:               - Mild Schatzki ring.                           - Z-line variable. Biopsied.                           - Small hiatal hernia.                           - Normal stomach.                           - Normal duodenal bulb, first portion of the                            duodenum and second portion of the duodenum. Moderate Sedation:      Per Anesthesia Care Recommendation:           - Patient has a contact number available for                            emergencies. The signs and symptoms of potential                            delayed complications were discussed with the                            patient. Return to normal activities tomorrow.                            Written discharge instructions were provided to the                            patient.                           - Resume previous diet.                           - Continue present medications.                           - Await pathology results.                           - Repeat upper endoscopy for surveillance based on  pathology results.                           - Return to GI clinic in 4 months.                           - Use Protonix (pantoprazole) 40 mg PO daily.                           - Patient without dysphagia, not consented for                            dilation so elected not to dilate esophageal ring                            today Procedure Code(s):        --- Professional ---                           [phone], Esophagogastroduodenoscopy, flexible,                            transoral; with biopsy, single or multiple Diagnosis Code(s):        --- Professional ---                           K22.2, Esophageal obstruction                           K22.89, Other specified disease of esophagus                           K44.9, Diaphragmatic hernia without obstruction or                             gangrene                           Z13.810, Encounter for screening for upper                            gastrointestinal disorder CPT copyright 2022 American Medical Association. All rights reserved. The codes documented in this report are preliminary and upon coder review may  be revised to meet current compliance requirements. Elon Alas. Abbey Chatters, DO [place] Abbey Chatters, DO 03/04/2022 8:19:45 AM This report has been signed electronically. Number of Addenda: 0

## 2022-03-05 LAB — SURGICAL PATHOLOGY

## 2022-03-11 ENCOUNTER — Encounter (HOSPITAL_COMMUNITY): Payer: Self-pay | Admitting: Internal Medicine

## 2022-04-17 ENCOUNTER — Ambulatory Visit (HOSPITAL_COMMUNITY)
Admission: RE | Admit: 2022-04-17 | Discharge: 2022-04-17 | Disposition: A | Discharge status: Home / Self Care | Payer: BC Managed Care – PPO | Source: Ambulatory Visit | Attending: Family Medicine | Admitting: Family Medicine

## 2022-04-17 ENCOUNTER — Other Ambulatory Visit (HOSPITAL_COMMUNITY): Payer: Self-pay | Admitting: Family Medicine

## 2022-04-17 DIAGNOSIS — Z7182 Exercise counseling: Secondary | ICD-10-CM | POA: Diagnosis not present

## 2022-04-17 DIAGNOSIS — M79672 Pain in left foot: Secondary | ICD-10-CM | POA: Diagnosis not present

## 2022-04-17 DIAGNOSIS — M7732 Calcaneal spur, left foot: Secondary | ICD-10-CM | POA: Diagnosis not present

## 2022-05-09 ENCOUNTER — Encounter (HOSPITAL_COMMUNITY): Payer: Self-pay | Admitting: *Deleted

## 2022-05-13 ENCOUNTER — Other Ambulatory Visit: Payer: Self-pay | Admitting: Cardiology

## 2022-05-17 DIAGNOSIS — R059 Cough, unspecified: Secondary | ICD-10-CM | POA: Diagnosis not present

## 2022-05-17 DIAGNOSIS — J069 Acute upper respiratory infection, unspecified: Secondary | ICD-10-CM | POA: Diagnosis not present

## 2022-05-20 DIAGNOSIS — J069 Acute upper respiratory infection, unspecified: Secondary | ICD-10-CM | POA: Diagnosis not present

## 2022-05-20 DIAGNOSIS — H1033 Unspecified acute conjunctivitis, bilateral: Secondary | ICD-10-CM | POA: Diagnosis not present

## 2022-06-02 NOTE — Progress Notes (Deleted)
GI Office Note    Referring Provider: Celene Squibb, MD Primary Care Physician:  Celene Squibb, MD Primary Gastroenterologist: Elon Alas. Abbey Chatters, DO  Date:  06/02/2022  ID:  Wesley [name], DOB 01-21-1961, MRN [id number]   Chief Complaint   No chief complaint on file.   History of Present Illness  Wesley [name] is a 62 y.o. male with a history of GERD, Afib on Eliquis, aortic atherosclerosis, HLD, HTN, history of esophageal strictures with history of multiple dilations, and food bolus in 2006*** presenting today for follow up post procedures.   Last office visit 01/24/22. No alarm symptoms. Need to schedule colonoscopy. Recent increase in epigastric pain and switched from omeprazole to pantoprazole with good result. Advised EGD for screening for barrett's esophagus.   EGD 03/04/22: -mild Schatzki ring, not dilated -z line biopsied (reflux changes) -small hiatal hernia -normal stomach -normal duodenum -Use pantoprazole 40 mg daily  Colonoscopy 03/04/22: - sigmoid diverticulosis -42m polyp in transverse colon -polyp hyperplastic -Repeat in 10 years   Today:    Current Outpatient Medications  Medication Sig Dispense Refill   apixaban (ELIQUIS) 5 MG TABS tablet TAKE 1 TABLET BY MOUTH TWICE A DAY 60 tablet 5   diltiazem (CARDIZEM) 30 MG tablet Take 1 tablet (30 mg total) by mouth every 6 (six) hours as needed. PALPITATIONS 60 tablet 0   ezetimibe (ZETIA) 10 MG tablet Take 1 tablet (10 mg total) by mouth daily. 90 tablet 3   flecainide (TAMBOCOR) 100 MG tablet TAKE 1 TABLET BY MOUTH TWICE A DAY 180 tablet 1   nebivolol (BYSTOLIC) 10 MG tablet Take 10 mg by mouth daily.     pantoprazole (PROTONIX) 40 MG tablet Take 1 tablet by mouth daily.     tamsulosin (FLOMAX) 0.4 MG CAPS capsule Take 1 tablet by mouth daily.     No current facility-administered medications for this visit.    Past Medical History:  Diagnosis Date   Aortic atherosclerosis (HCC)    Arthritis    [place]    Esophageal stricture    Multiple dilations   GERD (gastroesophageal reflux disease)    History of kidney stones    Hyperlipidemia    Hypertension    Sleep apnea     Past Surgical History:  Procedure Laterality Date   BIOPSY  03/04/2022   Procedure: BIOPSY;  Surgeon: CEloise Harman DO;  [facility];  Service: Endoscopy;;   COLONOSCOPY WITH PROPOFOL N/A 03/04/2022   Procedure: COLONOSCOPY WITH PROPOFOL;  Surgeon: CEloise Harman DO;  Location: [facility];  Service: Endoscopy;  Laterality: N/A;  8:00 am   ESOPHAGOGASTRODUODENOSCOPY (EGD) WITH PROPOFOL N/A 03/04/2022   Procedure: ESOPHAGOGASTRODUODENOSCOPY (EGD) WITH PROPOFOL;  Surgeon: CEloise Harman DO;  Location: [facility];  Service: Endoscopy;  Laterality: N/A;   Lumbar luminectomy  2007   Dr. KAshok Pall  Partial finger amputation     POLYPECTOMY  03/04/2022   Procedure: POLYPECTOMY;  Surgeon: CEloise Harman DO;  Location: [facility];  Service: Endoscopy;;    Family History  Problem Relation Age of Onset   Heart failure Father        Died age 62  Prostate cancer Father [phone]  Heart attack Brother        Age 465  Aortic stenosis Brother        Bicuspid valve status post AVR    Allergies as of 06/03/2022   (  No Known Allergies)    Social History   Socioeconomic History   Marital status: Married    Spouse name: Not on file   Number of children: Not on file   Years of education: Not on file   Highest education level: Not on file  Occupational History   Not on file  Tobacco Use   Smoking status: Former    Packs/day: 0.50    Years: 15.00    Total pack years: 7.50    Types: Cigarettes    Quit date: 12/30/2008    Years since quitting: 13.4    Passive exposure: Never   Smokeless tobacco: Current    Types: Snuff, Chew   Tobacco comments:    dips 2 cans/snuff per week  Substance and Sexual Activity   Alcohol use: Yes    Alcohol/week: 0.0 standard drinks  of alcohol    Comment: rare beer   Drug use: No   Sexual activity: Not on file  Other Topics Concern   Not on file  Social History Narrative   Lives in [place]   Works as a Freight forwarder at a [employer].   Social Determinants of Health   Financial Resource Strain: Not on file  Food Insecurity: Not on file  Transportation Needs: Not on file  Physical Activity: Not on file  Stress: Not on file  Social Connections: Not on file     Review of Systems  *** Gen: Denies fever, chills, anorexia. Denies fatigue, weakness, weight loss.  CV: Denies chest pain, palpitations, syncope, peripheral edema, and claudication. Resp: Denies dyspnea at rest, cough, wheezing, coughing up blood, and pleurisy. GI: See HPI Derm: Denies rash, itching, dry skin Psych: Denies depression, anxiety, memory loss, confusion. No homicidal or suicidal ideation.  Heme: Denies bruising, bleeding, and enlarged lymph nodes.   Physical Exam   There were no vitals taken for this visit.  General:   Alert and oriented. No distress noted. Pleasant and cooperative.  Head:  Normocephalic and atraumatic. Eyes:  Conjuctiva clear without scleral icterus. Mouth:  Oral mucosa pink and moist. Good dentition. No lesions. Lungs:  Clear to auscultation bilaterally. No wheezes, rales, or rhonchi. No distress.  Heart:  S1, S2 present without murmurs appreciated.  Abdomen:  +BS, soft, non-tender and non-distended. No rebound or guarding. No HSM or masses noted. Rectal: *** Msk:  Symmetrical without gross deformities. Normal posture. Extremities:  Without edema. Neurologic:  Alert and  oriented x4 Psych:  Alert and cooperative. Normal mood and affect.   Assessment  Wesley [name] is a 62 y.o. male with a history of GERD, Afib on Eliquis, aortic atherosclerosis, HLD, HTN, history of esophageal strictures with history of multiple dilations, and food bolus in 2006*** presenting today for follow up post procedures.   GERD, history of  esophageal strictures:  Recent histroy of colon polyps: Colonoscopy 03/04/22 with benign hyperplastic transverse colon polyp. Recommended repeat in 10 years.   PLAN   *** Continue pantoprazole 40 mg once daily.      Venetia Night, MSN, FNP-BC, AGACNP-BC [facility]

## 2022-06-03 ENCOUNTER — Ambulatory Visit: Payer: BC Managed Care – PPO | Admitting: Gastroenterology

## 2022-06-03 ENCOUNTER — Encounter: Payer: Self-pay | Admitting: Gastroenterology

## 2022-06-18 ENCOUNTER — Ambulatory Visit: Payer: BC Managed Care – PPO | Attending: Cardiology | Admitting: Cardiology

## 2022-06-18 ENCOUNTER — Encounter: Payer: Self-pay | Admitting: Cardiology

## 2022-06-18 VITALS — BP 120/70 | HR 58 | Ht 71.0 in | Wt 204.6 lb

## 2022-06-18 DIAGNOSIS — M791 Myalgia, unspecified site: Secondary | ICD-10-CM | POA: Diagnosis not present

## 2022-06-18 DIAGNOSIS — Z136 Encounter for screening for cardiovascular disorders: Secondary | ICD-10-CM | POA: Diagnosis not present

## 2022-06-18 DIAGNOSIS — I351 Nonrheumatic aortic (valve) insufficiency: Secondary | ICD-10-CM | POA: Diagnosis not present

## 2022-06-18 DIAGNOSIS — T466X5A Adverse effect of antihyperlipidemic and antiarteriosclerotic drugs, initial encounter: Secondary | ICD-10-CM

## 2022-06-18 DIAGNOSIS — T466X5D Adverse effect of antihyperlipidemic and antiarteriosclerotic drugs, subsequent encounter: Secondary | ICD-10-CM

## 2022-06-18 DIAGNOSIS — I480 Paroxysmal atrial fibrillation: Secondary | ICD-10-CM | POA: Diagnosis not present

## 2022-06-18 DIAGNOSIS — E782 Mixed hyperlipidemia: Secondary | ICD-10-CM

## 2022-06-18 NOTE — Progress Notes (Signed)
    Cardiology Office Note  Date: 06/18/2022   ID: Tora Duck, DOB 01-Apr-1961, MRN [id number]  History of Present Illness: Wesley [name] is a 62 y.o. male last seen in September 2023.  He is here for a routine visit.  Reports no increasing sense of palpitations on current medical regimen.  No bleeding problems on Eliquis.  ECG today shows sinus bradycardia with borderline prolonged PR interval, QRS 112 and normal QTc.  He is tolerating Zetia which was started after his last lipid panel with LDL 115.  He has known problems with statin myalgias.  We discussed getting a coronary calcium score particular in light of his family history of premature CAD.  May be a better candidate for PCSK9 inhibitors.  Also due for follow-up echocardiogram for surveillance of aortic regurgitation.  Physical Exam: VS:  BP 120/70   Pulse (!) 58   Ht 5\' 11"  (1.803 m)   Wt 204 lb 9.6 oz (92.8 kg)   SpO2 97%   BMI 28.54 kg/m , BMI Body mass index is 28.54 kg/m.  Wt Readings from Last 3 Encounters:  06/18/22 204 lb 9.6 oz (92.8 kg)  01/24/22 202 lb (91.6 kg)  12/05/21 201 lb 6.4 oz (91.4 kg)    General: Patient appears comfortable at rest. HEENT: Conjunctiva and lids normal. Neck: Supple, no elevated JVP or carotid bruits. Lungs: Clear to auscultation, nonlabored breathing at rest. Cardiac: Regular rate and rhythm, no S3 or significant systolic murmur, 1/6 diastolic murmur. Extremities: No pitting edema.  ECG:  An ECG dated 05/28/2021 was personally reviewed today and demonstrated:  Sinus rhythm.  Labwork:  September 2023: Hemoglobin 15, platelets 238, BUN 15, creatinine 1.41, potassium 5, AST 17, ALT 17, cholesterol 182, triglycerides 169, HDL 37, LDL 115  Other Studies Reviewed Today:  No interval cardiac testing for review today.  Assessment and Plan:  1.  Paroxysmal atrial fibrillation with CHA2DS2-VASc score of 2.  He is symptomatically stable on current regimen including flecainide, short  acting Cardizem, and Eliquis.  He reports no spontaneous bleeding problems.  I reviewed his lab work from September 2023.  ECG today is stable.  2.  Mixed hyperlipidemia with aortic atherosclerosis evident by CT imaging.  LDL was 115 in September 2023, he was started on Zetia in light of statin intolerance.  Plan to get a coronary calcium score particular in light of his family history of premature CAD.  3.  Aortic regurgitation, mild to moderate by echocardiogram in July 2021.  Updated echocardiogram will be obtained.  Disposition:  Follow up  6 months.  Signed, Satira Sark, M.D., F.A.C.C.

## 2022-06-18 NOTE — Patient Instructions (Addendum)
Medication Instructions:  Your physician recommends that you continue on your current medications as directed. Please refer to the Current Medication list given to you today.  Labwork: none  Testing/Procedures: Your physician has requested that you have an echocardiogram. Echocardiography is a painless test that uses sound waves to create images of your heart. It provides your doctor with information about the size and shape of your heart and how well your heart's chambers and valves are working. This procedure takes approximately one hour. There are no restrictions for this procedure. Please do NOT wear cologne, perfume, aftershave, or lotions (deodorant is allowed). Please arrive 15 minutes prior to your appointment time. Calcium Score CT  Follow-Up: Your physician recommends that you schedule a follow-up appointment in: 6 months  Any Other Special Instructions Will Be Listed Below (If Applicable).  If you need a refill on your cardiac medications before your next appointment, please call your pharmacy.

## 2022-06-19 DIAGNOSIS — I10 Essential (primary) hypertension: Secondary | ICD-10-CM | POA: Diagnosis not present

## 2022-06-19 DIAGNOSIS — R7301 Impaired fasting glucose: Secondary | ICD-10-CM | POA: Diagnosis not present

## 2022-06-24 DIAGNOSIS — I480 Paroxysmal atrial fibrillation: Secondary | ICD-10-CM | POA: Diagnosis not present

## 2022-06-24 DIAGNOSIS — R944 Abnormal results of kidney function studies: Secondary | ICD-10-CM | POA: Diagnosis not present

## 2022-06-24 DIAGNOSIS — I10 Essential (primary) hypertension: Secondary | ICD-10-CM | POA: Diagnosis not present

## 2022-06-24 DIAGNOSIS — E782 Mixed hyperlipidemia: Secondary | ICD-10-CM | POA: Diagnosis not present

## 2022-06-24 DIAGNOSIS — R109 Unspecified abdominal pain: Secondary | ICD-10-CM | POA: Diagnosis not present

## 2022-06-24 DIAGNOSIS — R7303 Prediabetes: Secondary | ICD-10-CM | POA: Diagnosis not present

## 2022-06-24 DIAGNOSIS — I2510 Atherosclerotic heart disease of native coronary artery without angina pectoris: Secondary | ICD-10-CM | POA: Diagnosis not present

## 2022-07-14 ENCOUNTER — Ambulatory Visit
Admission: EM | Admit: 2022-07-14 | Discharge: 2022-07-14 | Disposition: A | Discharge status: Home / Self Care | Payer: BC Managed Care – PPO | Attending: Nurse Practitioner | Admitting: Nurse Practitioner

## 2022-07-14 ENCOUNTER — Encounter: Payer: Self-pay | Admitting: Emergency Medicine

## 2022-07-14 DIAGNOSIS — H44001 Unspecified purulent endophthalmitis, right eye: Secondary | ICD-10-CM | POA: Diagnosis not present

## 2022-07-14 MED ORDER — TOBRAMYCIN-DEXAMETHASONE 0.3-0.1 % OP SUSP: 1.0000 [drp] | OPHTHALMIC | 0 refills | Status: DC

## 2022-07-14 MED ORDER — TOBRAMYCIN-DEXAMETHASONE 0.3-0.1 % OP SUSP: 1.0000 [drp] | OPHTHALMIC | 0 refills | Status: AC

## 2022-07-14 NOTE — Discharge Instructions (Addendum)
Apply eyedrops as prescribed. May take over-the-counter Tylenol as needed for pain, fever, or general discomfort. May apply cool compresses to the right eye to help with pain or swelling.  May also apply warm compresses for any eye discomfort. May use Visine or Clear Eyes eyedrops to help keep the eye moist and lubricated as needed. Avoid rubbing, scratching, or manipulating the eye while symptoms persist. Avoid use of your contacts until symptoms improve. Strict handwashing when applying the eyedrops. If you develop sudden loss of vision, change in vision, or other concerns, follow-up in the emergency department immediately.  As discussed, please follow-up with your eye doctor on 07/15/2022 for reevaluation. Follow-up as needed.

## 2022-07-14 NOTE — ED Provider Notes (Signed)
[facility]    CSN: [id number] Arrival date & time: 07/14/22  1335      History   Chief Complaint No chief complaint on file.   HPI Wesley [name] is a 62 y.o. male.   The history is provided by the patient and the spouse.   The patient presents for complaints of right eye redness, pain, and tearing.  Symptoms started earlier in the week, he states symptoms improved, but then they got worse yesterday.  Patient states he has been wearing his contact lens, states that he does not have them in today.  The patient complains of light sensitivity, redness of the right eye, right eye pain, and burning in the right eye.  He states that he was at work yesterday, but the pain in his eye became so bad that he had to leave.  He denies the feeling of "a foreign object" in the eye.  He also states that when he placed the contacts in, they were new contacts.  He states that he does have a history of eye infections.  Patient states that he was recently treated by his PCP for sinus infection.  The PCP also gave him polymyxin the eyedrops for his eyes.  He states the polymyxin and an over-the-counter eyedrop but that has not helped his symptoms.  Patient denies use of eyeglasses.  He denies visual changes, loss of vision, headache, dizziness, or purulent drainage. Past Medical History:  Diagnosis Date   Aortic atherosclerosis    Arthritis    Atrial fibrillation    Esophageal stricture    Multiple dilations   GERD (gastroesophageal reflux disease)    History of kidney stones    Hyperlipidemia    Hypertension    Sleep apnea     Patient Active Problem List   Diagnosis Date Noted   Aortic valve disorders 05/08/2010   Paroxysmal atrial fibrillation 05/08/2010   HYPERLIPIDEMIA-MIXED 05/07/2010   HYPERTENSION, BENIGN 05/07/2010   Palpitations 05/07/2010    Past Surgical History:  Procedure Laterality Date   BIOPSY  03/04/2022   Procedure: BIOPSY;  Surgeon: Lanelle Bal, DO;   [facility];  Service: Endoscopy;;   COLONOSCOPY WITH PROPOFOL N/A 03/04/2022   Procedure: COLONOSCOPY WITH PROPOFOL;  Surgeon: Lanelle Bal, DO;  [facility];  Service: Endoscopy;  Laterality: N/A;  8:00 am   ESOPHAGOGASTRODUODENOSCOPY (EGD) WITH PROPOFOL N/A 03/04/2022   Procedure: ESOPHAGOGASTRODUODENOSCOPY (EGD) WITH PROPOFOL;  Surgeon: Lanelle Bal, DO;  [facility];  Service: Endoscopy;  Laterality: N/A;   Lumbar luminectomy  2007   Dr. Coletta Memos   Partial finger amputation     POLYPECTOMY  03/04/2022   Procedure: POLYPECTOMY;  Surgeon: Lanelle Bal, DO;  [facility]: [facility];  Service: Endoscopy;;       Home Medications    Prior to Admission medications   Medication Sig Start Date End Date Taking? Authorizing Provider  apixaban (ELIQUIS) 5 MG TABS tablet TAKE 1 TABLET BY MOUTH TWICE A DAY 02/18/22   Jonelle Sidle, MD  diltiazem (CARDIZEM) 30 MG tablet Take 1 tablet (30 mg total) by mouth every 6 (six) hours as needed. PALPITATIONS 01/30/18   Devoria Albe, MD  ezetimibe (ZETIA) 10 MG tablet Take 1 tablet (10 mg total) by mouth daily. 01/15/22   Jonelle Sidle, MD  flecainide (TAMBOCOR) 100 MG tablet TAKE 1 TABLET BY MOUTH TWICE A DAY 05/13/22   Jonelle Sidle, MD  nebivolol (  BYSTOLIC) 10 MG tablet Take 10 mg by mouth daily.    [provider]  pantoprazole (PROTONIX) 40 MG tablet Take 1 tablet by mouth daily.    [provider]  tamsulosin (FLOMAX) 0.4 MG CAPS capsule Take 1 tablet by mouth daily. 12/21/21   [provider]  tobramycin-dexamethasone [facility]) ophthalmic solution Place 1 drop into the right eye every 4 (four) hours while awake for 7 days. 07/14/22 07/21/22  Kemo Spruce-Warren, Sadie Haber, NP    Family History Family History  Problem Relation Age of Onset   Heart failure Father        Died age 31   Prostate cancer Father 18 - 76   Heart attack Brother        Age 19   Aortic  stenosis Brother        Bicuspid valve status post AVR    Social History Social History   Tobacco Use   Smoking status: Former    Packs/day: 0.50    Years: 15.00    Additional pack years: 0.00    Total pack years: 7.50    Types: Cigarettes    Quit date: 12/30/2008    Years since quitting: 13.5    Passive exposure: Never   Smokeless tobacco: Current    Types: Snuff, Chew   Tobacco comments:    dips 2 cans/snuff per week  Substance Use Topics   Alcohol use: Yes    Alcohol/week: 0.0 standard drinks of alcohol    Comment: rare beer   Drug use: No     Allergies   Patient has no known allergies.   Review of Systems Review of Systems Per HPI  Physical Exam Triage Vital Signs ED Triage Vitals  Enc Vitals Group     BP 07/14/22 1338 (!) 159/79     Pulse Rate 07/14/22 1338 65     Resp 07/14/22 1338 18     Temp 07/14/22 1338 98 F (36.7 C)     Temp Source 07/14/22 1338 Oral     SpO2 07/14/22 1338 95 %     Weight --      Height --      Head Circumference --      Peak Flow --      Pain Score 07/14/22 1340 8     Pain Loc --      Pain Edu? --      Excl. in GC? --    No data found.  Updated Vital Signs BP (!) 159/79 (BP Location: Right Arm)   Pulse 65   Temp 98 F (36.7 C) (Oral)   Resp 18   SpO2 95%   Visual Acuity Right Eye Distance:   Left Eye Distance:   Bilateral Distance:    Right Eye Near:   Left Eye Near:    Bilateral Near:     Physical Exam Vitals and nursing note reviewed.  Constitutional:      Appearance: Normal appearance. He is not toxic-appearing.  HENT:     Head: Normocephalic.  Eyes:     General: Lids are normal.        Right eye: Discharge (clear) present. No foreign body.     Extraocular Movements: Extraocular movements intact.     Right eye: Normal extraocular motion and no nystagmus.     Conjunctiva/sclera:     Right eye: Right conjunctiva is injected. No chemosis, exudate or hemorrhage.    Pupils: Pupils are equal, round, and  reactive to light.  Cardiovascular:     Rate and Rhythm: Normal rate and regular rhythm.     Pulses: Normal pulses.     Heart sounds: Normal heart sounds.  Pulmonary:     Effort: Pulmonary effort is normal.     Breath sounds: Normal breath sounds.  Musculoskeletal:     Cervical back: Normal range of motion.  Skin:    General: Skin is warm and dry.  Neurological:     General: No focal deficit present.     Mental Status: He is alert and oriented to person, place, and time.  Psychiatric:        Mood and Affect: Mood normal.        Behavior: Behavior normal.      UC Treatments / Results  Labs (all labs ordered are listed, but only abnormal results are displayed) Labs Reviewed - No data to display  EKG   Radiology No results found.  Procedures Procedures (including critical care time)  Medications Ordered in UC Medications - No data to display  Initial Impression / Assessment and Plan / UC Course  I have reviewed the triage vital signs and the nursing notes.  Pertinent labs & imaging results that were available during my care of the patient were reviewed by me and considered in my medical decision making (see chart for details).  The patient is well-appearing, he is in no acute distress, he is hypertensive, but vital signs are otherwise stable.  Patient with redness, light sensitivity, and tearing of the right eye.  Patient denies feeling of a foreign object in the right eye.  On exam, there is no abnormal eye movement, or purulent drainage.  PERRL noted.  Will treat patient empirically with TobraDex to cover for possible infection and right eye inflammation.  Patient denies history of glaucoma.  Supportive care recommendations were provided and discussed with the patient to include use of cool or warm compresses, over-the-counter analgesics for pain or discomfort, and use of Clear Eyes or Visine for eye moisture.  Patient was given strict ER follow-up precautions.  Patient  reports that he is going to see his eye doctor tomorrow for reevaluation.  Patient is in agreement with this plan of care and verbalizes understanding.  All questions were answered.  Patient stable for discharge.  Work note was provided.   Final Clinical Impressions(s) / UC Diagnoses   Final diagnoses:  Eye infection, right     Discharge Instructions      Apply eyedrops as prescribed. May take over-the-counter Tylenol as needed for pain, fever, or general discomfort. May apply cool compresses to the right eye to help with pain or swelling.  May also apply warm compresses for any eye discomfort. May use Visine or Clear Eyes eyedrops to help keep the eye moist and lubricated as needed. Avoid rubbing, scratching, or manipulating the eye while symptoms persist. Avoid use of your contacts until symptoms improve. Strict handwashing when applying the eyedrops. If you develop sudden loss of vision, change in vision, or other concerns, follow-up in the emergency department immediately.  As discussed, please follow-up with your eye doctor on 07/15/2022 for reevaluation. Follow-up as needed.     ED Prescriptions     Medication Sig Dispense Auth. Provider   tobramycin-dexamethasone [facility]) ophthalmic solution  (Status: Discontinued) Place 1 drop into the right eye every 4 (four) hours while awake for 7 days. 5 mL Caydyn Sprung-Warren, Sadie Haber, NP   tobramycin-dexamethasone [facility]) ophthalmic solution  (Status: Discontinued) Place 1 drop into  the right eye every 4 (four) hours while awake for 7 days. 5 mL Tanisia Yokley-Warren, Sadie Haber, NP   tobramycin-dexamethasone [facility]) ophthalmic solution Place 1 drop into the right eye every 4 (four) hours while awake for 7 days. 5 mL Kamiya Acord-Warren, Sadie Haber, NP      PDMP not reviewed this encounter.   Abran Cantor, NP 07/14/22 1404

## 2022-07-14 NOTE — ED Triage Notes (Signed)
Right eye pain that is sensitive to light since yesterday.  States he wear contact lens, and removed the lens.  States pain started earlier in the week and got better and now has returned.  States eye has been tearing.   History of infection in that eye.

## 2022-07-16 ENCOUNTER — Ambulatory Visit: Payer: BC Managed Care – PPO | Attending: Cardiology

## 2022-07-16 DIAGNOSIS — I351 Nonrheumatic aortic (valve) insufficiency: Secondary | ICD-10-CM | POA: Diagnosis not present

## 2022-07-17 LAB — ECHOCARDIOGRAM COMPLETE
AR max vel: 2.11 cm2
AV Area VTI: 2.44 cm2
AV Area mean vel: 1.97 cm2
AV Mean grad: 4.5 mmHg
AV Peak grad: 8.7 mmHg
Ao pk vel: 1.48 m/s
Area-P 1/2: 3.6 cm2
Calc EF: 64.9 %
MV M vel: 2.96 m/s
MV Peak grad: 35 mmHg
P 1/2 time: 820 msec
S' Lateral: 2.2 cm
Single Plane A2C EF: 60.8 %
Single Plane A4C EF: 68.4 %

## 2022-08-13 ENCOUNTER — Ambulatory Visit (HOSPITAL_COMMUNITY)
Admission: RE | Admit: 2022-08-13 | Discharge: 2022-08-13 | Disposition: A | Discharge status: Home / Self Care | Payer: BC Managed Care – PPO | Source: Ambulatory Visit | Attending: Cardiology | Admitting: Cardiology

## 2022-08-13 DIAGNOSIS — Z136 Encounter for screening for cardiovascular disorders: Secondary | ICD-10-CM

## 2022-08-30 ENCOUNTER — Encounter: Payer: Self-pay | Admitting: *Deleted

## 2022-09-05 DIAGNOSIS — J019 Acute sinusitis, unspecified: Secondary | ICD-10-CM | POA: Diagnosis not present

## 2022-09-05 DIAGNOSIS — Z20822 Contact with and (suspected) exposure to covid-19: Secondary | ICD-10-CM | POA: Diagnosis not present

## 2022-09-05 DIAGNOSIS — R059 Cough, unspecified: Secondary | ICD-10-CM | POA: Diagnosis not present

## 2022-09-17 ENCOUNTER — Telehealth: Payer: Self-pay | Admitting: Cardiology

## 2022-09-17 DIAGNOSIS — E782 Mixed hyperlipidemia: Secondary | ICD-10-CM

## 2022-09-17 NOTE — Telephone Encounter (Signed)
Results reviewed.  Coronary calcium score was elevated at 169 and would suggest that we do need to get his LDL (bad cholesterol) under much better control.  In light of statin intolerance and suboptimal response to Zetia, would suggest Pharm.D. lipid clinic referral for discussion of PCSK9 inhibitors.  Do you agree to seeing our pharmacist to discuss starting injectable cholesterol medication to better manage your cholesterol?  Patient notified and verbalized understanding. Patient agreeable to lipid clinic pharm D

## 2022-09-17 NOTE — Telephone Encounter (Signed)
Patient returned RN's call regarding results.  Patient noted he works first shift and can reach him after 3:30 pm

## 2022-10-15 DIAGNOSIS — I480 Paroxysmal atrial fibrillation: Secondary | ICD-10-CM | POA: Diagnosis not present

## 2022-10-18 ENCOUNTER — Encounter: Payer: Self-pay | Admitting: Student

## 2022-10-18 ENCOUNTER — Ambulatory Visit: Payer: BC Managed Care – PPO | Attending: Cardiology | Admitting: Student

## 2022-10-18 DIAGNOSIS — E782 Mixed hyperlipidemia: Secondary | ICD-10-CM | POA: Diagnosis not present

## 2022-10-18 NOTE — Assessment & Plan Note (Signed)
Assessment:  LDL goal: < 70 mg/dl last LDLc 96 mg/dl  Tolerates Zetia well without any side effects  Intolerance to rosuvastatin and bempedoic acid - sever myalgia and fatigue- was affecting mobility Discussed next potential options (PCSK-9 inhibitors, inclisiran); cost, dosing efficacy, side effects  Reiterated importance of regular exercise and heart healthy diet   Plan: Continue taking current medications - Zetia 10 mg daily  Will apply for PA for PCSK9i; will inform patient upon approval  Lipid lab due in 2-3 months after starting PCSK9i Patient to cut down on eating out and follow heart healthy diet  Start doing regular exercise ( should get 150 minutes of moderate-intensity physical activity)

## 2022-10-18 NOTE — Patient Instructions (Signed)
Your Results:             Your most recent labs Goal  Total Cholesterol 156 < 200  Triglycerides 139 < 150  HDL (happy/good cholesterol) 35 > 40  LDL (lousy/bad cholesterol 96 < 70   Medication changes: We will start the process to get PCSK9i (Repatha or Praluent) covered by your insurance.  Once the prior authorization is complete, we will call you to let you know and confirm pharmacy information.    Praluent is a cholesterol medication that improved your body's ability to get rid of "bad cholesterol" known as LDL. It can lower your LDL up to 60%. It is an injection that is given under the skin every 2 weeks. The most common side effects of Praluent include runny nose, symptoms of the common cold, rarely flu or flu-like symptoms, back/muscle pain in about 3-4% of the patients, and redness, pain, or bruising at the injection site.    Repatha is a cholesterol medication that improved your body's ability to get rid of "bad cholesterol" known as LDL. It can lower your LDL up to 60%! It is an injection that is given under the skin every 2 weeks. The most common side effects of Repatha include runny nose, symptoms of the common cold, rarely flu or flu-like symptoms, back/muscle pain in about 3-4% of the patients, and redness, pain, or bruising at the injection site.   Lab orders: We want to repeat labs after 2-3 months.  We will send you a lab order to remind you once we get closer to that time.

## 2022-10-18 NOTE — Progress Notes (Signed)
Patient ID: BRANDO TAVES                 DOB: November 06, 1960                    MRN: [id number]      HPI: Wesley [name] is a 62 y.o. male patient referred to lipid clinic by Dr.McDowell. PMH is significant for PAF, Aortic valve disorder, HTN, CAD,HDL.  Patient presented today for lipid clinic. Reports over the years he has tried various statins, could not remember the names or dose. He was unable to tolerate them due to extreme fatigue and muscle weakness. He has been taking Zetia and tolerating it well . His job involves physical work so he does not do any exercise. He loves red meat and they eat out a lot. We reviewed non-statin LDLc lowering options- PCSK-9 inhibitors, and inclisiran.  Discussed mechanisms of action, dosing, side effects and potential decreases in LDL cholesterol.  Also reviewed cost information and potential options for patient assistance.  Current Medications: Zetia 10 mg  Intolerances: rosuvastatin, Nexletol - myalgia Risk Factors: CAC score 169,CAD, family hx of premature MI (brother MI at age 27) LDL goal: <70 mg/dl Last lab: TC 409,WJ 191,YNW 35, LDL 96 Diet: loves meat and eats out a lot.   Exercise: none- works full time and job involve lots of physical work   Family History:  Father Heart failure Died age 55  Prostate cancer (Age: 71 - 80)   Brother Heart attack Age 74  Brother Aortic stenosis Bicuspid valve status post AVR    Social History:  Alcohol: 1-2 drinks per week  Tobacco: chewing    Labs: Lipid Panel  No results found for: "CHOL", "TRIG", "HDL", "CHOLHDL", "VLDL", "LDLCALC", "LDLDIRECT", "LABVLDL"  Past Medical History:  Diagnosis Date   Aortic atherosclerosis (HCC)    Arthritis    Atrial fibrillation (HCC)    Esophageal stricture    Multiple dilations   GERD (gastroesophageal reflux disease)    History of kidney stones    Hyperlipidemia    Hypertension    Sleep apnea     Current Outpatient Medications on File Prior to Visit   Medication Sig Dispense Refill   apixaban (ELIQUIS) 5 MG TABS tablet TAKE 1 TABLET BY MOUTH TWICE A DAY 60 tablet 5   diltiazem (CARDIZEM) 30 MG tablet Take 1 tablet (30 mg total) by mouth every 6 (six) hours as needed. PALPITATIONS 60 tablet 0   ezetimibe (ZETIA) 10 MG tablet Take 1 tablet (10 mg total) by mouth daily. 90 tablet 3   flecainide (TAMBOCOR) 100 MG tablet TAKE 1 TABLET BY MOUTH TWICE A DAY 180 tablet 1   nebivolol (BYSTOLIC) 10 MG tablet Take 10 mg by mouth daily.     pantoprazole (PROTONIX) 40 MG tablet Take 1 tablet by mouth daily.     tamsulosin (FLOMAX) 0.4 MG CAPS capsule Take 1 tablet by mouth daily.     No current facility-administered medications on file prior to visit.    No Known Allergies  Assessment/Plan:  1. Hyperlipidemia -  Problem  HYPERLIPIDEMIA-MIXED   Qualifier: Diagnosis of  By: Zachary George   Current Medications: Zetia 10 mg  Intolerances: rosuvastatin, Nexletol - myalgia Risk Factors: CAC score 169,CAD, family hx of premature MI (brother MI at age 21) LDL goal: <70 mg/dl Last lab: TC 295,AO 130,QMV 35, LDL 96    HYPERLIPIDEMIA-MIXED Assessment:  LDL goal: < 70 mg/dl last LDLc 96 mg/dl  Tolerates Zetia well without any side effects  Intolerance to rosuvastatin and bempedoic acid - sever myalgia and fatigue- was affecting mobility Discussed next potential options (PCSK-9 inhibitors, inclisiran); cost, dosing efficacy, side effects  Reiterated importance of regular exercise and heart healthy diet   Plan: Continue taking current medications - Zetia 10 mg daily  Will apply for PA for PCSK9i; will inform patient upon approval  Lipid lab due in 2-3 months after starting PCSK9i Patient to cut down on eating out and follow heart healthy diet  Start doing regular exercise ( should get 150 minutes of moderate-intensity physical activity)    Thank you,  Carmela Hurt, Pharm.D [facility] of [facility] [address], [place], [place] [address]  Phone: [phone]; Fax: [phone]

## 2022-11-10 ENCOUNTER — Other Ambulatory Visit: Payer: Self-pay | Admitting: Cardiology

## 2022-12-26 ENCOUNTER — Ambulatory Visit: Payer: BC Managed Care – PPO | Admitting: Cardiology

## 2023-02-10 DIAGNOSIS — J302 Other seasonal allergic rhinitis: Secondary | ICD-10-CM | POA: Diagnosis not present

## 2023-02-10 DIAGNOSIS — Z79899 Other long term (current) drug therapy: Secondary | ICD-10-CM | POA: Diagnosis not present

## 2023-02-10 DIAGNOSIS — J019 Acute sinusitis, unspecified: Secondary | ICD-10-CM | POA: Diagnosis not present

## 2023-02-10 DIAGNOSIS — I10 Essential (primary) hypertension: Secondary | ICD-10-CM | POA: Diagnosis not present

## 2023-03-11 DIAGNOSIS — R7303 Prediabetes: Secondary | ICD-10-CM | POA: Diagnosis not present

## 2023-03-11 DIAGNOSIS — E782 Mixed hyperlipidemia: Secondary | ICD-10-CM | POA: Diagnosis not present

## 2023-03-11 DIAGNOSIS — Z125 Encounter for screening for malignant neoplasm of prostate: Secondary | ICD-10-CM | POA: Diagnosis not present

## 2023-03-17 DIAGNOSIS — R109 Unspecified abdominal pain: Secondary | ICD-10-CM | POA: Diagnosis not present

## 2023-03-17 DIAGNOSIS — R7303 Prediabetes: Secondary | ICD-10-CM | POA: Diagnosis not present

## 2023-03-17 DIAGNOSIS — Z23 Encounter for immunization: Secondary | ICD-10-CM | POA: Diagnosis not present

## 2023-03-17 DIAGNOSIS — Z0000 Encounter for general adult medical examination without abnormal findings: Secondary | ICD-10-CM | POA: Diagnosis not present

## 2023-03-17 DIAGNOSIS — I480 Paroxysmal atrial fibrillation: Secondary | ICD-10-CM | POA: Diagnosis not present

## 2023-03-17 DIAGNOSIS — I10 Essential (primary) hypertension: Secondary | ICD-10-CM | POA: Diagnosis not present

## 2023-04-09 ENCOUNTER — Ambulatory Visit: Payer: BC Managed Care – PPO | Attending: Cardiology | Admitting: Cardiology

## 2023-04-09 ENCOUNTER — Other Ambulatory Visit: Payer: Self-pay | Admitting: *Deleted

## 2023-04-09 ENCOUNTER — Encounter: Payer: Self-pay | Admitting: Cardiology

## 2023-04-09 VITALS — BP 122/86 | HR 56 | Ht 71.0 in | Wt 205.6 lb

## 2023-04-09 DIAGNOSIS — T466X5D Adverse effect of antihyperlipidemic and antiarteriosclerotic drugs, subsequent encounter: Secondary | ICD-10-CM

## 2023-04-09 DIAGNOSIS — R931 Abnormal findings on diagnostic imaging of heart and coronary circulation: Secondary | ICD-10-CM

## 2023-04-09 DIAGNOSIS — I480 Paroxysmal atrial fibrillation: Secondary | ICD-10-CM | POA: Diagnosis not present

## 2023-04-09 DIAGNOSIS — E782 Mixed hyperlipidemia: Secondary | ICD-10-CM | POA: Diagnosis not present

## 2023-04-09 DIAGNOSIS — I351 Nonrheumatic aortic (valve) insufficiency: Secondary | ICD-10-CM

## 2023-04-09 DIAGNOSIS — T466X5A Adverse effect of antihyperlipidemic and antiarteriosclerotic drugs, initial encounter: Secondary | ICD-10-CM

## 2023-04-09 DIAGNOSIS — M791 Myalgia, unspecified site: Secondary | ICD-10-CM

## 2023-04-09 MED ORDER — EZETIMIBE 10 MG PO TABS: 10.0000 mg | ORAL_TABLET | Freq: Every day | ORAL | 3 refills | Status: DC

## 2023-04-09 MED ORDER — FLECAINIDE ACETATE 100 MG PO TABS: 100.0000 mg | ORAL_TABLET | Freq: Two times a day (BID) | ORAL | 2 refills | Status: DC

## 2023-04-09 MED ORDER — APIXABAN 5 MG PO TABS: 5.0000 mg | ORAL_TABLET | Freq: Two times a day (BID) | ORAL | 1 refills | Status: DC

## 2023-04-09 MED ORDER — APIXABAN 5 MG PO TABS: 5.0000 mg | ORAL_TABLET | Freq: Two times a day (BID) | ORAL | 5 refills | Status: DC

## 2023-04-09 NOTE — Progress Notes (Signed)
    Cardiology Office Note  Date: 04/09/2023   ID: DURWARD MATRANGA, DOB [birth year], MRN [id number]  History of Present Illness: Wesley [name] is a 63 y.o. male last seen in March 2024.  He is here for a follow-up visit.  Reports no change in atrial fibrillation, generally very brief episodes that last a few minutes at a time.  Stable NYHA class II dyspnea, no exertional chest pain.  He was seen in the lipid clinic back in July 2024.  Discussed PCSK9 inhibitors at that time with approval requested.  He has not heard back at this point.  I reviewed his medications.  Current cardiovascular regimen includes Eliquis , flecainide , Bystolic, and as needed short acting Cardizem .  He stopped taking Zetia , no specific side effects however.  His most recent lipids show increase in LDL up to 152.  I reviewed his ECG today which shows sinus bradycardia with prolonged PR interval, normal QTc.  Physical Exam: VS:  BP 122/86 (BP Location: Left Arm, Cuff Size: Normal)   Pulse (!) 56   Ht 5' 11 (1.803 m)   Wt 205 lb 9.6 oz (93.3 kg)   SpO2 99%   BMI 28.68 kg/m , BMI Body mass index is 28.68 kg/m.  Wt Readings from Last 3 Encounters:  04/09/23 205 lb 9.6 oz (93.3 kg)  06/18/22 204 lb 9.6 oz (92.8 kg)  01/24/22 202 lb (91.6 kg)    General: Patient appears comfortable at rest. HEENT: Conjunctiva and lids normal. Neck: Supple, no elevated JVP or carotid bruits. Lungs: Clear to auscultation, nonlabored breathing at rest. Cardiac: Regular rate and rhythm, no S3, 1/6 diastolic murmur, no pericardial rub. Extremities: No pitting edema.  ECG:  An ECG dated 06/18/2022 was personally reviewed today and demonstrated:  Sinus bradycardia with borderline prolonged PR interval, QRS 112 ms and normal QTc.  Labwork:  December 2024: Hemoglobin 14.5, platelets 234, BUN 18, creatinine 1.28, GFR 63, potassium 4.6, AST 24, ALT 17, cholesterol 218, triglycerides 140, HDL 41, LDL 152, hemoglobin A1c 6%  Other Studies  Reviewed Today:  No interval cardiac testing for review today.  Assessment and Plan:  1.  Paroxysmal atrial fibrillation with CHA2DS2-VASc score of 2.  No escalation in symptoms on current regimen which includes flecainide , Eliquis , Bystolic, and as needed short acting Cardizem .  I reviewed his most recent lab work.  No spontaneous bleeding problems reported.   2.  Mixed hyperlipidemia with aortic atherosclerosis evident by CT imaging and coronary calcium score of 169.  LDL 152 in December 2024.  He has a history of statin intolerance and also intolerance to bempedoic acid.  I asked him to resume Zetia  for now we will double check with the lipid clinic regarding proceeding with PCSK9 inhibitors.   3.  Aortic regurgitation, mild to moderate by echocardiogram in April 2024.  Aortic root 40 mm.  LVEF 60 to 65% without chamber dilatation.  4. OSA, currently not using CPAP.  States that he is undergoing a home sleep study soon, hopefully can get on stable treatment as this will also help his atrial fibrillation.  Disposition:  Follow up  6 months.  Signed, Jayson JUDITHANN Sierras, M.D., F.A.C.C. [facility]

## 2023-04-09 NOTE — Addendum Note (Signed)
 Addended by: Louanna Raw on: 04/09/2023 09:36 AM   Modules accepted: Orders

## 2023-04-09 NOTE — Patient Instructions (Addendum)
 Medication Instructions:  Your physician has recommended you make the following change in your medication:  Restart ezetimibe  10 mg daily Continue all other medications as prescribed  Labwork: none  Testing/Procedures: none  Follow-Up: Your physician recommends that you schedule a follow-up appointment in: 6 months  Any Other Special Instructions Will Be Listed Below (If Applicable).  If you need a refill on your cardiac medications before your next appointment, please call your pharmacy.

## 2023-04-09 NOTE — Telephone Encounter (Signed)
 Prescription refill request for Eliquis received. Indication: PAF Last office visit: 04/09/23  Ival Bible MD Scr: 1.25 on 06/19/22  Epic Age: 63 Weight: 93.3kg  Based on above findings Eliquis 5mg  twice daily is the appropriate dose.  Refill approved.

## 2023-04-11 ENCOUNTER — Telehealth: Payer: Self-pay | Admitting: Pharmacist

## 2023-04-11 ENCOUNTER — Other Ambulatory Visit (HOSPITAL_COMMUNITY): Payer: Self-pay

## 2023-04-11 ENCOUNTER — Telehealth: Payer: Self-pay | Admitting: Pharmacy Technician

## 2023-04-11 NOTE — Telephone Encounter (Signed)
 Pharmacy Patient Advocate Encounter   Received notification from Pt Calls Messages that prior authorization for repatha  is required/requested.   Insurance verification completed.   The patient is insured through [facility] ADVANTAGE/RX ADVANCE .   Per test claim: The current 04/11/23 day co-pay is, $30.00 one month.  No PA needed at this time. This test claim was processed through [facility]- copay amounts may vary at other pharmacies due to pharmacy/plan contracts, or as the patient moves through the different stages of their insurance plan.

## 2023-04-14 MED ORDER — REPATHA SURECLICK 140 MG/ML ~~LOC~~ SOAJ: 140.0000 mg | SUBCUTANEOUS | 3 refills | Status: DC

## 2023-04-14 NOTE — Addendum Note (Signed)
 Addended by: Tylene Fantasia on: 04/14/2023 11:46 AM   Modules accepted: Orders

## 2023-04-14 NOTE — Telephone Encounter (Signed)
 Call patient to inform about PA and Co-pay of Repatha. N/A LVM   Prescription sent to preferred [facility].

## 2023-04-17 NOTE — Telephone Encounter (Signed)
PA for Repatha approved see other encounter

## 2023-07-04 NOTE — Telephone Encounter (Signed)
 Due for follow up lipid lab, LM with wife ,get him to call us back.

## 2023-08-11 DIAGNOSIS — R7303 Prediabetes: Secondary | ICD-10-CM | POA: Diagnosis not present

## 2023-08-11 DIAGNOSIS — E782 Mixed hyperlipidemia: Secondary | ICD-10-CM | POA: Diagnosis not present

## 2023-08-14 DIAGNOSIS — G4733 Obstructive sleep apnea (adult) (pediatric): Secondary | ICD-10-CM | POA: Diagnosis not present

## 2023-08-26 ENCOUNTER — Ambulatory Visit
Admission: EM | Admit: 2023-08-26 | Discharge: 2023-08-26 | Disposition: A | Discharge status: Home / Self Care | Attending: Family Medicine | Admitting: Family Medicine

## 2023-08-26 DIAGNOSIS — R509 Fever, unspecified: Secondary | ICD-10-CM | POA: Diagnosis not present

## 2023-08-26 DIAGNOSIS — J208 Acute bronchitis due to other specified organisms: Secondary | ICD-10-CM | POA: Diagnosis not present

## 2023-08-26 LAB — POC COVID19/FLU A&B COMBO
Covid Antigen, POC: NEGATIVE
Influenza A Antigen, POC: NEGATIVE
Influenza B Antigen, POC: NEGATIVE

## 2023-08-26 MED ORDER — DEXAMETHASONE SODIUM PHOSPHATE 10 MG/ML IJ SOLN
10.0000 mg | Freq: Once | INTRAMUSCULAR | Status: AC
  Administered 2023-08-26: 10 mg via INTRAMUSCULAR

## 2023-08-26 MED ORDER — PROMETHAZINE-DM 6.25-15 MG/5ML PO SYRP: 5.0000 mL | ORAL_SOLUTION | Freq: Four times a day (QID) | ORAL | 0 refills | Status: DC | PRN

## 2023-08-26 MED ORDER — AZELASTINE HCL 0.1 % NA SOLN: 1.0000 | Freq: Two times a day (BID) | NASAL | 0 refills | Status: AC

## 2023-08-26 NOTE — ED Triage Notes (Signed)
 Pt reports fever, cough, congestion, body ache headache, x4 days

## 2023-08-29 NOTE — ED Provider Notes (Signed)
 [facility]    CSN: [id number] Arrival date & time: 08/26/23  1909      History   Chief Complaint No chief complaint on file.   HPI Wesley [name] is a 63 y.o. male.   Patient presenting today with 4-day history of fever, cough, congestion, body aches, headache, fatigue.  Denies chest pain, shortness of breath, abdominal pain, vomiting, diarrhea.  So far trying over-the-counter remedies with minimal relief.  No known history of chronic pulmonary disease.    Past Medical History:  Diagnosis Date   Aortic atherosclerosis (HCC)    Arthritis    Atrial fibrillation (HCC)    Esophageal stricture    Multiple dilations   GERD (gastroesophageal reflux disease)    History of kidney stones    Hyperlipidemia    Hypertension    Sleep apnea     Patient Active Problem List   Diagnosis Date Noted   Aortic valve disorder 05/08/2010   Paroxysmal atrial fibrillation (HCC) 05/08/2010   HYPERLIPIDEMIA-MIXED 05/07/2010   HYPERTENSION, BENIGN 05/07/2010   Palpitations 05/07/2010    Past Surgical History:  Procedure Laterality Date   BIOPSY  03/04/2022   Procedure: BIOPSY;  Surgeon: Vinetta Greening, DO;  [facility];  Service: Endoscopy;;   COLONOSCOPY WITH PROPOFOL  N/A 03/04/2022   Procedure: COLONOSCOPY WITH PROPOFOL ;  Surgeon: Vinetta Greening, DO;  [facility];  Service: Endoscopy;  Laterality: N/A;  8:00 am   ESOPHAGOGASTRODUODENOSCOPY (EGD) WITH PROPOFOL  N/A 03/04/2022   Procedure: ESOPHAGOGASTRODUODENOSCOPY (EGD) WITH PROPOFOL ;  Surgeon: Vinetta Greening, DO;  [facility];  Service: Endoscopy;  Laterality: N/A;   Lumbar luminectomy  2007   Dr. Audie Bleacher   Partial finger amputation     POLYPECTOMY  03/04/2022   Procedure: POLYPECTOMY;  Surgeon: Vinetta Greening, DO;  [facility];  Service: Endoscopy;;       Home Medications    Prior to Admission medications   Medication Sig Start Date End Date Taking?  Authorizing Provider  azelastine (ASTELIN) 0.1 % nasal spray Place 1 spray into both nostrils 2 (two) times daily. Use in each nostril as directed 08/26/23  Yes Corbin Dess, PA-C  promethazine-dextromethorphan (PROMETHAZINE-DM) 6.25-15 MG/5ML syrup Take 5 mLs by mouth 4 (four) times daily as needed. 08/26/23  Yes Corbin Dess, PA-C  apixaban  (ELIQUIS ) 5 MG TABS tablet Take 1 tablet (5 mg total) by mouth 2 (two) times daily. 04/09/23   Gerard Knight, MD  Azelastine HCl 137 MCG/SPRAY SOLN Place 2 sprays into both nostrils 2 (two) times daily. 02/10/23   [provider]  diltiazem  (CARDIZEM ) 30 MG tablet Take 1 tablet (30 mg total) by mouth every 6 (six) hours as needed. PALPITATIONS 01/30/18   Knapp, Iva, MD  Evolocumab  (REPATHA  SURECLICK) 140 MG/ML SOAJ Inject 140 mg into the skin every 14 (fourteen) days. 04/14/23   Gerard Knight, MD  ezetimibe  (ZETIA ) 10 MG tablet Take 1 tablet (10 mg total) by mouth daily. 04/09/23   Gerard Knight, MD  flecainide  (TAMBOCOR ) 100 MG tablet Take 1 tablet (100 mg total) by mouth 2 (two) times daily. 04/09/23   Gerard Knight, MD  nebivolol (BYSTOLIC) 10 MG tablet Take 10 mg by mouth daily.    [provider]  pantoprazole (PROTONIX) 40 MG tablet Take 1 tablet by mouth daily.    [provider]    Family History Family History  Problem Relation Age of Onset  Heart failure Father        Died age 56   Prostate cancer Father 46 - 18   Heart attack Brother        Age 48   Aortic stenosis Brother        Bicuspid valve status post AVR    Social History Social History   Tobacco Use   Smoking status: Former    Current packs/day: 0.00    Average packs/day: 0.5 packs/day for 15.0 years (7.5 ttl pk-yrs)    Types: Cigarettes    Start date: 12/30/1993    Quit date: 12/30/2008    Years since quitting: 14.6    Passive exposure: Never   Smokeless tobacco: Current    Types: Snuff, Chew   Tobacco comments:     dips 2 cans/snuff per week  Substance Use Topics   Alcohol use: Yes    Alcohol/week: 0.0 standard drinks of alcohol    Comment: rare beer   Drug use: No     Allergies   Patient has no known allergies.   Review of Systems Review of Systems Per HPI   Physical Exam Triage Vital Signs ED Triage Vitals  Encounter Vitals Group     BP 08/26/23 1928 (!) 178/85     Systolic BP Percentile --      Diastolic BP Percentile --      Pulse Rate 08/26/23 1928 73     Resp 08/26/23 1928 20     Temp 08/26/23 1928 99 F (37.2 C)     Temp Source 08/26/23 1928 Oral     SpO2 08/26/23 1928 90 %     Weight --      Height --      Head Circumference --      Peak Flow --      Pain Score 08/26/23 1930 5     Pain Loc --      Pain Education --      Exclude from Growth Chart --    No data found.  Updated Vital Signs BP (!) 178/85 (BP Location: Right Arm)   Pulse 73   Temp 99 F (37.2 C) (Oral)   Resp 20   SpO2 96%   Visual Acuity Right Eye Distance:   Left Eye Distance:   Bilateral Distance:    Right Eye Near:   Left Eye Near:    Bilateral Near:     Physical Exam Vitals and nursing note reviewed.  Constitutional:      Appearance: He is well-developed.  HENT:     Head: Atraumatic.     Right Ear: External ear normal.     Left Ear: External ear normal.     Nose: Rhinorrhea present.     Mouth/Throat:     Pharynx: No oropharyngeal exudate or posterior oropharyngeal erythema.  Eyes:     Conjunctiva/sclera: Conjunctivae normal.     Pupils: Pupils are equal, round, and reactive to light.  Cardiovascular:     Rate and Rhythm: Normal rate and regular rhythm.  Pulmonary:     Effort: Pulmonary effort is normal. No respiratory distress.     Breath sounds: No wheezing or rales.  Musculoskeletal:        General: Normal range of motion.     Cervical back: Normal range of motion and neck supple.  Lymphadenopathy:     Cervical: No cervical adenopathy.  Skin:    General: Skin is warm  and dry.  Neurological:     Mental  Status: He is alert and oriented to person, place, and time.  Psychiatric:        Behavior: Behavior normal.      UC Treatments / Results  Labs (all labs ordered are listed, but only abnormal results are displayed) Labs Reviewed  POC COVID19/FLU A&B COMBO    EKG   Radiology No results found.  Procedures Procedures (including critical care time)  Medications Ordered in UC Medications  dexamethasone  (DECADRON ) injection 10 mg (10 mg Intramuscular Given 08/26/23 2002)    Initial Impression / Assessment and Plan / UC Course  I have reviewed the triage vital signs and the nursing notes.  Pertinent labs & imaging results that were available during my care of the patient were reviewed by me and considered in my medical decision making (see chart for details).     Hypertensive in triage, otherwise vital signs within normal limits.  Overall well-appearing and in no acute distress.  Rapid flu and COVID-negative.  Will treat for viral bronchitis with IM Decadron , Phenergan DM, Astelin, supportive over-the-counter medications and home care.  Return for worsening symptoms.  Final Clinical Impressions(s) / UC Diagnoses   Final diagnoses:  Viral bronchitis  Fever, unspecified   Discharge Instructions   None    ED Prescriptions     Medication Sig Dispense Auth. Provider   promethazine-dextromethorphan (PROMETHAZINE-DM) 6.25-15 MG/5ML syrup Take 5 mLs by mouth 4 (four) times daily as needed. 100 mL Corbin Dess, PA-C   azelastine (ASTELIN) 0.1 % nasal spray Place 1 spray into both nostrils 2 (two) times daily. Use in each nostril as directed 30 mL Corbin Dess, PA-C      PDMP not reviewed this encounter.   Corbin Dess, [place] 08/29/23 1551

## 2023-09-01 ENCOUNTER — Ambulatory Visit
Admission: EM | Admit: 2023-09-01 | Discharge: 2023-09-01 | Disposition: A | Discharge status: Home / Self Care | Attending: Nurse Practitioner | Admitting: Nurse Practitioner

## 2023-09-01 DIAGNOSIS — J22 Unspecified acute lower respiratory infection: Secondary | ICD-10-CM

## 2023-09-01 MED ORDER — AMOXICILLIN-POT CLAVULANATE 875-125 MG PO TABS: 1.0000 | ORAL_TABLET | Freq: Two times a day (BID) | ORAL | 0 refills | Status: DC

## 2023-09-01 MED ORDER — HYDROCODONE BIT-HOMATROP MBR 5-1.5 MG/5ML PO SOLN: 5.0000 mL | Freq: Four times a day (QID) | ORAL | 0 refills | Status: DC | PRN

## 2023-09-01 MED ORDER — ALBUTEROL SULFATE HFA 108 (90 BASE) MCG/ACT IN AERS: 2.0000 | INHALATION_SPRAY | Freq: Four times a day (QID) | RESPIRATORY_TRACT | 0 refills | Status: AC | PRN

## 2023-09-01 MED ORDER — PREDNISONE 20 MG PO TABS: 40.0000 mg | ORAL_TABLET | Freq: Every day | ORAL | 0 refills | Status: DC

## 2023-09-01 MED ORDER — PREDNISONE 20 MG PO TABS: 40.0000 mg | ORAL_TABLET | Freq: Every day | ORAL | 0 refills | Status: AC

## 2023-09-01 MED ORDER — ALBUTEROL SULFATE HFA 108 (90 BASE) MCG/ACT IN AERS: 2.0000 | INHALATION_SPRAY | Freq: Four times a day (QID) | RESPIRATORY_TRACT | 0 refills | Status: DC | PRN

## 2023-09-01 NOTE — ED Triage Notes (Signed)
 Cough, congestion, SOB with exertion x 10 days. Taking prescribed medication from last visit on 5/27 and still not having any relief of symptoms.

## 2023-09-01 NOTE — ED Provider Notes (Signed)
 [facility]    CSN: [id number] Arrival date & time: 09/01/23  1810      History   Chief Complaint Chief Complaint  Patient presents with   Cough    HPI Wesley [name] is a 63 y.o. male.   The history is provided by the patient.   Patient presents for follow-up for continued cough, fatigue, shortness of breath, fever, and wheezing.  Patient was seen in this clinic on 08/26/2023 and diagnosed with viral bronchitis.  Patient states he was prescribed azelastine  nasal spray, and Promethazine  DM for the cough.  States he was also given a steroid injection here in the office.  Patient states his symptoms have not improved since he was seen, in fact states that he has worsened.  Patient reports his last fever was around 102 days ago.  Patient reports that he becomes short of breath and begins wheezing with any exertion.  Patient states that he has been taking the medications with minimal relief of his symptoms. Past Medical History:  Diagnosis Date   Aortic atherosclerosis (HCC)    Arthritis    Atrial fibrillation (HCC)    Esophageal stricture    Multiple dilations   GERD (gastroesophageal reflux disease)    History of kidney stones    Hyperlipidemia    Hypertension    Sleep apnea     Patient Active Problem List   Diagnosis Date Noted   Aortic valve disorder 05/08/2010   Paroxysmal atrial fibrillation (HCC) 05/08/2010   HYPERLIPIDEMIA-MIXED 05/07/2010   HYPERTENSION, BENIGN 05/07/2010   Palpitations 05/07/2010    Past Surgical History:  Procedure Laterality Date   BIOPSY  03/04/2022   Procedure: BIOPSY;  Surgeon: Vinetta Greening, DO;  [facility];  Service: Endoscopy;;   COLONOSCOPY WITH PROPOFOL  N/A 03/04/2022   Procedure: COLONOSCOPY WITH PROPOFOL ;  Surgeon: Vinetta Greening, DO;  [facility];  Service: Endoscopy;  Laterality: N/A;  8:00 am   ESOPHAGOGASTRODUODENOSCOPY (EGD) WITH PROPOFOL  N/A 03/04/2022   Procedure:  ESOPHAGOGASTRODUODENOSCOPY (EGD) WITH PROPOFOL ;  Surgeon: Vinetta Greening, DO;  [facility];  Service: Endoscopy;  Laterality: N/A;   Lumbar luminectomy  2007   Dr. Audie Bleacher   Partial finger amputation     POLYPECTOMY  03/04/2022   Procedure: POLYPECTOMY;  Surgeon: Vinetta Greening, DO;  [facility];  Service: Endoscopy;;       Home Medications    Prior to Admission medications   Medication Sig Start Date End Date Taking? Authorizing Provider  apixaban  (ELIQUIS ) 5 MG TABS tablet Take 1 tablet (5 mg total) by mouth 2 (two) times daily. 04/09/23  Yes Gerard Knight, MD  Evolocumab  (REPATHA  SURECLICK) 140 MG/ML SOAJ Inject 140 mg into the skin every 14 (fourteen) days. 04/14/23  Yes Gerard Knight, MD  ezetimibe  (ZETIA ) 10 MG tablet Take 1 tablet (10 mg total) by mouth daily. 04/09/23  Yes Gerard Knight, MD  flecainide  (TAMBOCOR ) 100 MG tablet Take 1 tablet (100 mg total) by mouth 2 (two) times daily. 04/09/23  Yes Gerard Knight, MD  nebivolol (BYSTOLIC) 10 MG tablet Take 10 mg by mouth daily.   Yes [provider]  pantoprazole (PROTONIX) 40 MG tablet Take 1 tablet by mouth daily.   Yes [provider]  azelastine  (ASTELIN ) 0.1 % nasal spray Place 1 spray into both nostrils 2 (two) times daily. Use in each nostril as directed 08/26/23   Corbin Dess, PA-C  Azelastine  HCl 137 MCG/SPRAY SOLN Place 2 sprays into both nostrils 2 (two) times daily. 02/10/23   [provider]  diltiazem  (CARDIZEM ) 30 MG tablet Take 1 tablet (30 mg total) by mouth every 6 (six) hours as needed. PALPITATIONS 01/30/18   Knapp, Iva, MD  promethazine -dextromethorphan (PROMETHAZINE -DM) 6.25-15 MG/5ML syrup Take 5 mLs by mouth 4 (four) times daily as needed. 08/26/23   Corbin Dess, PA-C    Family History Family History  Problem Relation Age of Onset   Heart failure Father        Died age 87   Prostate cancer Father 82 - 35   Heart  attack Brother        Age 22   Aortic stenosis Brother        Bicuspid valve status post AVR    Social History Social History   Tobacco Use   Smoking status: Former    Current packs/day: 0.00    Average packs/day: 0.5 packs/day for 15.0 years (7.5 ttl pk-yrs)    Types: Cigarettes    Start date: 12/30/1993    Quit date: 12/30/2008    Years since quitting: 14.6    Passive exposure: Never   Smokeless tobacco: Current    Types: Snuff, Chew   Tobacco comments:    dips 2 cans/snuff per week  Vaping Use   Vaping status: Never Used  Substance Use Topics   Alcohol use: Yes    Alcohol/week: 0.0 standard drinks of alcohol    Comment: rare beer   Drug use: No     Allergies   Patient has no known allergies.   Review of Systems Review of Systems Per HPI  Physical Exam Triage Vital Signs ED Triage Vitals  Encounter Vitals Group     BP 09/01/23 1824 (!) 183/97     Systolic BP Percentile --      Diastolic BP Percentile --      Pulse Rate 09/01/23 1824 69     Resp 09/01/23 1824 18     Temp 09/01/23 1824 98.3 F (36.8 C)     Temp Source 09/01/23 1824 Oral     SpO2 09/01/23 1824 94 %     Weight --      Height --      Head Circumference --      Peak Flow --      Pain Score 09/01/23 1825 5     Pain Loc --      Pain Education --      Exclude from Growth Chart --    No data found.  Updated Vital Signs BP (!) 183/97 (BP Location: Right Arm)   Pulse 69   Temp 98.3 F (36.8 C) (Oral)   Resp 18   SpO2 94%   Visual Acuity Right Eye Distance:   Left Eye Distance:   Bilateral Distance:    Right Eye Near:   Left Eye Near:    Bilateral Near:     Physical Exam Vitals and nursing note reviewed.  Constitutional:      General: He is not in acute distress.    Appearance: Normal appearance.  HENT:     Head: Normocephalic.     Right Ear: Tympanic membrane, ear canal and external ear normal.     Left Ear: Tympanic membrane, ear canal and external ear normal.     Nose:  Congestion present.     Right Turbinates: Enlarged and swollen.     Left Turbinates: Enlarged and swollen.  Right Sinus: No maxillary sinus tenderness or frontal sinus tenderness.     Left Sinus: No maxillary sinus tenderness or frontal sinus tenderness.     Mouth/Throat:     Lips: Pink.     Mouth: Mucous membranes are moist.     Pharynx: Postnasal drip present. No pharyngeal swelling, oropharyngeal exudate, posterior oropharyngeal erythema or uvula swelling.     Comments: Cobblestoning present to posterior oropharynx  Eyes:     Extraocular Movements: Extraocular movements intact.     Conjunctiva/sclera: Conjunctivae normal.     Pupils: Pupils are equal, round, and reactive to light.  Cardiovascular:     Rate and Rhythm: Normal rate and regular rhythm.     Pulses: Normal pulses.     Heart sounds: Normal heart sounds.  Pulmonary:     Effort: Pulmonary effort is normal. No respiratory distress.     Breath sounds: Normal breath sounds. No stridor. No wheezing, rhonchi or rales.  Abdominal:     General: Bowel sounds are normal.     Palpations: Abdomen is soft.     Tenderness: There is no abdominal tenderness.  Musculoskeletal:     Cervical back: Normal range of motion.  Skin:    General: Skin is warm and dry.  Neurological:     General: No focal deficit present.     Mental Status: He is alert and oriented to person, place, and time.  Psychiatric:        Mood and Affect: Mood normal.        Behavior: Behavior normal.      UC Treatments / Results  Labs (all labs ordered are listed, but only abnormal results are displayed) Labs Reviewed - No data to display  EKG   Radiology No results found.  Procedures Procedures (including critical care time)  Medications Ordered in UC Medications - No data to display  Initial Impression / Assessment and Plan / UC Course  I have reviewed the triage vital signs and the nursing notes.  Pertinent labs & imaging results that were  available during my care of the patient were reviewed by me and considered in my medical decision making (see chart for details).  Patient presents for follow-up for continued cough, fatigue, shortness of breath, and wheezing.  Unable to perform imaging due to staffing.  Patient with continued fever as well.  Will treat for lower respiratory infection empirically with Augmentin  875/125 mg tablets twice daily for the next 7 days.  For his cough, Hycodan cough syrup was prescribed.  Patient was also prescribed prednisone 20 mg for bronchial inflammation and an albuterol inhaler for wheezing or shortness of breath.  Supportive care recommendations were provided and discussed with the patient to include fluids, rest, over-the-counter analgesics, and use of a humidifier during sleep.  Patient was advised if symptoms fail to improve, recommend follow-up with his PCP for further evaluation.  Patient was in agreement with this plan of care and verbalizes understanding.  All questions were answered.  Patient stable for discharge.  Final Clinical Impressions(s) / UC Diagnoses   Final diagnoses:  None   Discharge Instructions   None    ED Prescriptions   None    PDMP not reviewed this encounter.   Hardy Lia, NP 09/02/23 [phone]

## 2023-09-01 NOTE — Discharge Instructions (Addendum)
 Take medication as prescribed. Increase fluids and allow for plenty of rest. Recommend Tylenol  as needed for pain, fever, or general discomfort. Warm salt water gargles 3-4 times daily to help with throat pain or discomfort. Recommend using a humidifier at bedtime during sleep to help with cough and nasal congestion. It will also be helpful for you to sleep elevated on pillows while symptoms persist.  Follow-up if your symptoms do not improve.

## 2023-09-04 DIAGNOSIS — I480 Paroxysmal atrial fibrillation: Secondary | ICD-10-CM | POA: Diagnosis not present

## 2023-09-04 DIAGNOSIS — E782 Mixed hyperlipidemia: Secondary | ICD-10-CM | POA: Diagnosis not present

## 2023-09-04 DIAGNOSIS — R109 Unspecified abdominal pain: Secondary | ICD-10-CM | POA: Diagnosis not present

## 2023-09-04 DIAGNOSIS — I10 Essential (primary) hypertension: Secondary | ICD-10-CM | POA: Diagnosis not present

## 2023-09-04 DIAGNOSIS — R7303 Prediabetes: Secondary | ICD-10-CM | POA: Diagnosis not present

## 2023-12-16 ENCOUNTER — Telehealth: Payer: Self-pay | Admitting: Cardiology

## 2023-12-16 ENCOUNTER — Ambulatory Visit: Attending: Cardiology | Admitting: Cardiology

## 2023-12-16 ENCOUNTER — Encounter: Payer: Self-pay | Admitting: Cardiology

## 2023-12-16 VITALS — BP 142/90 | HR 64 | Ht 71.0 in | Wt 205.8 lb

## 2023-12-16 DIAGNOSIS — R072 Precordial pain: Secondary | ICD-10-CM

## 2023-12-16 DIAGNOSIS — E782 Mixed hyperlipidemia: Secondary | ICD-10-CM

## 2023-12-16 DIAGNOSIS — R931 Abnormal findings on diagnostic imaging of heart and coronary circulation: Secondary | ICD-10-CM

## 2023-12-16 DIAGNOSIS — I480 Paroxysmal atrial fibrillation: Secondary | ICD-10-CM

## 2023-12-16 DIAGNOSIS — I351 Nonrheumatic aortic (valve) insufficiency: Secondary | ICD-10-CM

## 2023-12-16 NOTE — Patient Instructions (Signed)
 Medication Instructions:  Your physician has recommended you make the following change in your medication:  Stop taking Zetia   Continue taking all other medications as prescribed  Labwork: None  Testing/Procedures: Your physician has requested that you have a lexiscan  myoview . For further information please visit [email or web address] Please follow instruction sheet, as given.   Follow-Up: Your physician recommends that you schedule a follow-up appointment in: 6 months  Any Other Special Instructions Will Be Listed Below (If Applicable). Thank you for choosing [facility]!     If you need a refill on your cardiac medications before your next appointment, please call your pharmacy.

## 2023-12-16 NOTE — Progress Notes (Signed)
    Cardiology Office Note  Date: 12/16/2023   ID: Wesley [name], DOB 1960/04/30, MRN [id number]  History of Present Illness: Wesley [name] is a 63 y.o. male last seen in January.  He is here for a follow-up visit.  Continues to work very long hours at his job, under a lot of stress.  He has had some chest discomfort at work in the setting of stress, we discussed getting follow-up ischemic testing arranged.  He otherwise reports fairly good control of atrial fibrillation with few palpitations on current regimen.  Medications reviewed.  He is tolerating Repatha , his LDL came down to 28.  We discussed stopping Zetia  at this point.  He does not report any spontaneous bleeding problems on Eliquis .  Physical Exam: VS:  BP (!) 142/90   Pulse 64   Ht 5' 11 (1.803 m)   Wt 205 lb 12.8 oz (93.4 kg)   SpO2 96%   BMI 28.70 kg/m , BMI Body mass index is 28.7 kg/m.  Wt Readings from Last 3 Encounters:  12/16/23 205 lb 12.8 oz (93.4 kg)  04/09/23 205 lb 9.6 oz (93.3 kg)  06/18/22 204 lb 9.6 oz (92.8 kg)    General: Patient appears comfortable at rest. HEENT: Conjunctiva and lids normal. Lungs: Clear to auscultation, nonlabored breathing at rest. Cardiac: Regular rate and rhythm, no S3 or significant systolic murmur.  ECG:  An ECG dated 06/18/2022 was personally reviewed today and demonstrated:  Sinus bradycardia with borderline prolonged PR interval, QRS 112 ms and normal QTc.  Labwork:  May 2025: Hemoglobin 13.4, platelets 219, BUN 18, creatinine 1.51, GFR 52, potassium 4.5, AST 20, ALT 18, cholesterol 86, triglycerides 94, HDL 40, LDL 28, hemoglobin A1c 6%.  Other Studies Reviewed Today:  No interval cardiac testing for review today.  Assessment and Plan:  1.  Paroxysmal atrial fibrillation with CHA2DS2-VASc score of 2.  He reports overall good control of symptoms with few palpitations.  Currently on Eliquis  5 mg twice daily for stroke prophylaxis.  Also on flecainide  100 mg twice daily  and Bystolic 10 mg daily.  He has short acting Cardizem  30 mg tablets to take as needed.  2.  Intermittent chest discomfort in the setting of stress at work.  No known ischemic heart disease although no recent testing.  Plan to obtain a Lexiscan  Myoview  for screening.   3.  Mixed hyperlipidemia with aortic atherosclerosis evident by CT imaging and coronary calcium score of 169.  He has a history of statin intolerance and also intolerance to bempedoic acid.  He is now on Repatha  140 mg every 14 days and LDL down to 28 in May.  Plan to stop Zetia  at this point.   4.  Aortic regurgitation, mild to moderate by echocardiogram in April 2024.  Aortic root 40 mm.  LVEF 60 to 65% without chamber dilatation.   5. OSA.  He reports compliance with CPAP.  Disposition:  Follow up 6 months.  Signed, Wesley [name], M.D., F.A.C.C. [facility]

## 2023-12-16 NOTE — Telephone Encounter (Signed)
 Checking percert on the following patient for testing scheduled at [facility]    LEXISCAN       12-23-2023

## 2023-12-23 ENCOUNTER — Ambulatory Visit: Payer: Self-pay | Admitting: Cardiology

## 2023-12-23 ENCOUNTER — Encounter (HOSPITAL_COMMUNITY)
Admission: RE | Admit: 2023-12-23 | Discharge: 2023-12-23 | Disposition: A | Discharge status: Home / Self Care | Source: Ambulatory Visit | Attending: Cardiology | Admitting: Cardiology

## 2023-12-23 ENCOUNTER — Other Ambulatory Visit: Payer: Self-pay | Admitting: Physician Assistant

## 2023-12-23 ENCOUNTER — Ambulatory Visit (HOSPITAL_COMMUNITY)
Admission: RE | Admit: 2023-12-23 | Discharge: 2023-12-23 | Disposition: A | Discharge status: Home / Self Care | Source: Ambulatory Visit | Attending: Cardiology | Admitting: Cardiology

## 2023-12-23 DIAGNOSIS — I351 Nonrheumatic aortic (valve) insufficiency: Secondary | ICD-10-CM

## 2023-12-23 DIAGNOSIS — I480 Paroxysmal atrial fibrillation: Secondary | ICD-10-CM

## 2023-12-23 DIAGNOSIS — R072 Precordial pain: Secondary | ICD-10-CM | POA: Diagnosis not present

## 2023-12-23 LAB — NM MYOCAR MULTI W/SPECT W/WALL MOTION / EF
LV dias vol: 130 mL (ref 62–150)
LV sys vol: 66 mL (ref 4.2–5.8)
MPHR: 157 {beats}/min
Nuc Stress EF: 49 %
Peak HR: 83 {beats}/min
Percent HR: 52 %
RATE: 0.8
Rest HR: 53 {beats}/min
Rest Nuclear Isotope Dose: 10.6 mCi
SDS: 2
SRS: 0
SSS: 2
ST Depression (mm): 0 mm
Stress Nuclear Isotope Dose: 31 mCi
TID: 1.03

## 2023-12-23 MED ORDER — SODIUM CHLORIDE FLUSH 0.9 % IV SOLN
INTRAVENOUS | Status: AC
  Administered 2023-12-23: 10 mL via INTRAVENOUS
  Filled 2023-12-23: qty 10

## 2023-12-23 MED ORDER — TECHNETIUM TC 99M TETROFOSMIN IV KIT
10.6000 | PACK | Freq: Once | INTRAVENOUS | Status: AC | PRN
  Administered 2023-12-23: 10.6 via INTRAVENOUS

## 2023-12-23 MED ORDER — REGADENOSON 0.4 MG/5ML IV SOLN
INTRAVENOUS | Status: AC
  Administered 2023-12-23: 0.4 mg via INTRAVENOUS
  Filled 2023-12-23: qty 5

## 2023-12-23 MED ORDER — TECHNETIUM TC 99M TETROFOSMIN IV KIT
30.0000 | PACK | Freq: Once | INTRAVENOUS | Status: AC | PRN
  Administered 2023-12-23: 31 via INTRAVENOUS

## 2023-12-23 NOTE — Progress Notes (Signed)
     Wesley [name] presented for a Lexiscan  nuclear stress test today.  I Lorette CINDERELLA Kapur, PA-C, provided direct supervision and was present during the stress portion of the study today, which was completed without significant symptoms, immediate complications, or acute ST/T changes on ECG.  Stress imaging is pending at this time.  Preliminary ECG findings may be listed in the chart, but the stress test result will not be finalized until perfusion imaging is complete.  Lorette CINDERELLA Kapur, PA-C  12/23/2023, 9:30 AM

## 2024-01-08 ENCOUNTER — Ambulatory Visit: Payer: Self-pay | Admitting: Cardiology

## 2024-01-08 ENCOUNTER — Ambulatory Visit: Attending: Cardiology

## 2024-01-08 DIAGNOSIS — I480 Paroxysmal atrial fibrillation: Secondary | ICD-10-CM | POA: Diagnosis not present

## 2024-01-08 DIAGNOSIS — I351 Nonrheumatic aortic (valve) insufficiency: Secondary | ICD-10-CM

## 2024-01-08 LAB — ECHOCARDIOGRAM COMPLETE
AR max vel: 3.9 cm2
AV Peak grad: 8.5 mmHg
AV Vena cont: 0.7 cm
Ao pk vel: 1.46 m/s
Area-P 1/2: 2.42 cm2
Calc EF: 59.4 %
MV M vel: 5.25 m/s
MV Peak grad: 110.3 mmHg
P 1/2 time: 627 ms
S' Lateral: 3.3 cm
Single Plane A2C EF: 57.1 %
Single Plane A4C EF: 58.9 %

## 2024-01-24 ENCOUNTER — Ambulatory Visit
Admission: EM | Admit: 2024-01-24 | Discharge: 2024-01-24 | Disposition: A | Discharge status: Home / Self Care | Attending: Nurse Practitioner | Admitting: Nurse Practitioner

## 2024-01-24 ENCOUNTER — Ambulatory Visit

## 2024-01-24 ENCOUNTER — Encounter: Payer: Self-pay | Admitting: Emergency Medicine

## 2024-01-24 DIAGNOSIS — J019 Acute sinusitis, unspecified: Secondary | ICD-10-CM

## 2024-01-24 DIAGNOSIS — Z981 Arthrodesis status: Secondary | ICD-10-CM | POA: Diagnosis not present

## 2024-01-24 DIAGNOSIS — R059 Cough, unspecified: Secondary | ICD-10-CM

## 2024-01-24 DIAGNOSIS — R10A2 Flank pain, left side: Secondary | ICD-10-CM | POA: Diagnosis not present

## 2024-01-24 DIAGNOSIS — Z87442 Personal history of urinary calculi: Secondary | ICD-10-CM

## 2024-01-24 DIAGNOSIS — M549 Dorsalgia, unspecified: Secondary | ICD-10-CM | POA: Diagnosis not present

## 2024-01-24 LAB — POCT URINE DIPSTICK
Glucose, UA: NEGATIVE mg/dL
Ketones, POC UA: NEGATIVE mg/dL
Leukocytes, UA: NEGATIVE
Nitrite, UA: NEGATIVE
Protein Ur, POC: 30 mg/dL — AB
Spec Grav, UA: >=1.03 — AB (ref 1.010–1.025)
Urobilinogen, UA: 1 U/dL
pH, UA: 6 (ref 5.0–8.0)

## 2024-01-24 MED ORDER — FLUTICASONE PROPIONATE 50 MCG/ACT NA SUSP: 2.0000 | Freq: Every day | NASAL | 0 refills | Status: AC

## 2024-01-24 MED ORDER — AMOXICILLIN-POT CLAVULANATE 875-125 MG PO TABS: 1.0000 | ORAL_TABLET | Freq: Two times a day (BID) | ORAL | 0 refills | Status: AC

## 2024-01-24 MED ORDER — PROMETHAZINE-DM 6.25-15 MG/5ML PO SYRP: 5.0000 mL | ORAL_SOLUTION | Freq: Four times a day (QID) | ORAL | 0 refills | Status: AC | PRN

## 2024-01-24 MED ORDER — TAMSULOSIN HCL 0.4 MG PO CAPS: 0.4000 mg | ORAL_CAPSULE | Freq: Every day | ORAL | 0 refills | Status: AC

## 2024-01-24 NOTE — ED Triage Notes (Signed)
 Fever off and on with nasal congestion and cough x 7 days.  Has been taking mucinex and delsym.  States having pain in  left flank area when coughing

## 2024-01-24 NOTE — Discharge Instructions (Addendum)
 Take medication as prescribed. You may take over-the-counter Tylenol  as needed for pain, fever, or general discomfort. Increase your fluid intake while symptoms persist. Recommend normal saline nasal spray throughout the day for nasal congestion and runny nose. For your cough, recommend use of a humidifier in your bedroom at nighttime during sleep and sleeping elevated on pillows while cough symptoms persist.  For your flank pain: Take medication as prescribed. Continue to drink plenty of fluids. You may take over-the-counter Tylenol  as needed for pain or discomfort. Develop a toileting schedule that lit will allow you to urinate at least every 2 hours. Avoid caffeine such as tea, soda, or coffee while symptoms persist. Go to the emergency department if you experience worsening flank pain, develop new urinary symptoms, fever, or chills. Please follow-up with your primary care physician to discuss possible referral to urology if symptoms continue to persist. Follow-up as needed.

## 2024-01-24 NOTE — ED Provider Notes (Signed)
 [facility]    CSN: [id number] Arrival date & time: 01/24/24  1224      History   Chief Complaint No chief complaint on file.   HPI Wesley [name] is a 63 y.o. male.   The history is provided by the patient.   Patient presents with a 7-day history of low-grade fevers, cough, headache, nasal congestion, and sinus pressure.  Tmax 102.  Patient also complains of pain on the left flank.  States pain worsens with the cough.  He denies ear pain, ear drainage, wheezing, difficulty breathing, urinary frequency, urgency, hesitancy, abdominal pain, nausea, vomiting, or diarrhea.  Patient reports a history of decreased urine stream, states he has been speaking to his PCP about this concern.  Patient reports prior history of kidney stone.  Past Medical History:  Diagnosis Date   Aortic atherosclerosis    Arthritis    Atrial fibrillation (HCC)    Esophageal stricture    Multiple dilations   GERD (gastroesophageal reflux disease)    History of kidney stones    Hyperlipidemia    Hypertension    Sleep apnea     Patient Active Problem List   Diagnosis Date Noted   Aortic valve disorder 05/08/2010   Paroxysmal atrial fibrillation (HCC) 05/08/2010   HYPERLIPIDEMIA-MIXED 05/07/2010   HYPERTENSION, BENIGN 05/07/2010   Palpitations 05/07/2010    Past Surgical History:  Procedure Laterality Date   BIOPSY  03/04/2022   Procedure: BIOPSY;  Surgeon: Cindie Carlin POUR, DO;  Location: [facility];  Service: Endoscopy;;   COLONOSCOPY WITH PROPOFOL  N/A 03/04/2022   Procedure: COLONOSCOPY WITH PROPOFOL ;  Surgeon: Cindie Carlin POUR, DO;  Location: [facility];  Service: Endoscopy;  Laterality: N/A;  8:00 am   ESOPHAGOGASTRODUODENOSCOPY (EGD) WITH PROPOFOL  N/A 03/04/2022   Procedure: ESOPHAGOGASTRODUODENOSCOPY (EGD) WITH PROPOFOL ;  Surgeon: Cindie Carlin POUR, DO;  Location: [facility];  Service: Endoscopy;  Laterality: N/A;   Lumbar luminectomy  2007   Dr. Rockey [place]   Partial  finger amputation     POLYPECTOMY  03/04/2022   Procedure: POLYPECTOMY;  Surgeon: Cindie Carlin POUR, DO;  Location: [facility];  Service: Endoscopy;;       Home Medications    Prior to Admission medications   Medication Sig Start Date End Date Taking? Authorizing Provider  amoxicillin -clavulanate (AUGMENTIN ) 875-125 MG tablet Take 1 tablet by mouth every 12 (twelve) hours. 01/24/24  Yes Leath-Warren, Etta PARAS, NP  fluticasone (FLONASE) 50 MCG/ACT nasal spray Place 2 sprays into both nostrils daily. 01/24/24  Yes Leath-Warren, Etta PARAS, NP  promethazine -dextromethorphan (PROMETHAZINE -DM) 6.25-15 MG/5ML syrup Take 5 mLs by mouth 4 (four) times daily as needed. 01/24/24  Yes Leath-Warren, Etta PARAS, NP  tamsulosin (FLOMAX) 0.4 MG CAPS capsule Take 1 capsule (0.4 mg total) by mouth daily after supper. 01/24/24  Yes Leath-Warren, Etta PARAS, NP  albuterol  (VENTOLIN  HFA) 108 (90 Base) MCG/ACT inhaler Inhale 2 puffs into the lungs every 6 (six) hours as needed. 09/01/23   Leath-Warren, Etta PARAS, NP  apixaban  (ELIQUIS ) 5 MG TABS tablet Take 1 tablet (5 mg total) by mouth 2 (two) times daily. 04/09/23   Debera Jayson MATSU, MD  azelastine  (ASTELIN ) 0.1 % nasal spray Place 1 spray into both nostrils 2 (two) times daily. Use in each nostril as directed 08/26/23   Stuart Vernell Norris, PA-C  Azelastine  HCl 137 MCG/SPRAY SOLN Place 2 sprays into both nostrils 2 (two) times daily. 02/10/23   [provider]  diltiazem  (CARDIZEM ) 30  MG tablet Take 1 tablet (30 mg total) by mouth every 6 (six) hours as needed. PALPITATIONS 01/30/18   Randol Albany, MD  Evolocumab  (REPATHA  SURECLICK) 140 MG/ML SOAJ Inject 140 mg into the skin every 14 (fourteen) days. 04/14/23   Debera Jayson MATSU, MD  flecainide  (TAMBOCOR ) 100 MG tablet Take 1 tablet (100 mg total) by mouth 2 (two) times daily. 04/09/23   Debera Jayson MATSU, MD  nebivolol (BYSTOLIC) 10 MG tablet Take 10 mg by mouth daily.    [provider]   pantoprazole (PROTONIX) 40 MG tablet Take 1 tablet by mouth daily.    [provider]    Family History Family History  Problem Relation Age of Onset   Heart failure Father        Died age 63   Prostate cancer Father 75 - 54   Heart attack Brother        Age 26   Aortic stenosis Brother        Bicuspid valve status post AVR    Social History Social History   Tobacco Use   Smoking status: Former    Current packs/day: 0.00    Average packs/day: 0.5 packs/day for 15.0 years (7.5 ttl pk-yrs)    Types: Cigarettes    Start date: 12/30/1993    Quit date: 12/30/2008    Years since quitting: 15.0    Passive exposure: Never   Smokeless tobacco: Current    Types: Snuff, Chew   Tobacco comments:    dips 2 cans/snuff per week  Vaping Use   Vaping status: Never Used  Substance Use Topics   Alcohol use: Yes    Alcohol/week: 0.0 standard drinks of alcohol    Comment: rare beer   Drug use: No     Allergies   Patient has no known allergies.   Review of Systems Review of Systems Per HPI  Physical Exam Triage Vital Signs ED Triage Vitals  Encounter Vitals Group     BP 01/24/24 1334 122/76     Girls Systolic BP Percentile --      Girls Diastolic BP Percentile --      Boys Systolic BP Percentile --      Boys Diastolic BP Percentile --      Pulse Rate 01/24/24 1334 67     Resp 01/24/24 1334 18     Temp 01/24/24 1334 98.2 F (36.8 C)     Temp Source 01/24/24 1334 Oral     SpO2 01/24/24 1334 94 %     Weight --      Height --      Head Circumference --      Peak Flow --      Pain Score 01/24/24 1336 5     Pain Loc --      Pain Education --      Exclude from Growth Chart --    No data found.  Updated Vital Signs BP 122/76 (BP Location: Right Arm)   Pulse 67   Temp 98.2 F (36.8 C) (Oral)   Resp 18   SpO2 94%   Visual Acuity Right Eye Distance:   Left Eye Distance:   Bilateral Distance:    Right Eye Near:   Left Eye Near:    Bilateral Near:      Physical Exam Vitals and nursing note reviewed.  Constitutional:      General: He is not in acute distress.    Appearance: Normal appearance. He is well-developed.  HENT:  Head: Normocephalic and atraumatic.     Right Ear: Tympanic membrane, ear canal and external ear normal.     Left Ear: Tympanic membrane, ear canal and external ear normal.     Nose: Congestion present.     Right Turbinates: Enlarged and swollen.     Left Turbinates: Enlarged and swollen.     Right Sinus: No maxillary sinus tenderness or frontal sinus tenderness.     Left Sinus: No maxillary sinus tenderness or frontal sinus tenderness.     Mouth/Throat:     Lips: Pink.     Mouth: Mucous membranes are moist.     Pharynx: Uvula midline. Posterior oropharyngeal erythema and postnasal drip present. No pharyngeal swelling, oropharyngeal exudate or uvula swelling.     Comments: Cobblestoning present to posterior oropharynx  Eyes:     Extraocular Movements: Extraocular movements intact.     Conjunctiva/sclera: Conjunctivae normal.     Pupils: Pupils are equal, round, and reactive to light.  Neck:     Thyroid: No thyromegaly.     Trachea: No tracheal deviation.  Cardiovascular:     Rate and Rhythm: Normal rate and regular rhythm.     Pulses: Normal pulses.     Heart sounds: Normal heart sounds.  Pulmonary:     Effort: Pulmonary effort is normal. No respiratory distress.     Breath sounds: Normal breath sounds. No stridor. No wheezing, rhonchi or rales.  Abdominal:     General: Bowel sounds are normal.     Palpations: Abdomen is soft.     Tenderness: There is no abdominal tenderness. There is left CVA tenderness.  Musculoskeletal:     Cervical back: Normal range of motion and neck supple.  Skin:    General: Skin is warm and dry.  Neurological:     General: No focal deficit present.     Mental Status: He is alert and oriented to person, place, and time.  Psychiatric:        Mood and Affect: Mood normal.         Behavior: Behavior normal.        Thought Content: Thought content normal.        Judgment: Judgment normal.      UC Treatments / Results  Labs (all labs ordered are listed, but only abnormal results are displayed) Labs Reviewed  POCT URINE DIPSTICK - Abnormal; Notable for the following components:      Result Value   Color, UA brown (*)    Bilirubin, UA small (*)    Spec Grav, UA >=1.030 (*)    Blood, UA small (*)    Protein Ur, POC =30 (*)    All other components within normal limits    EKG   Radiology DG Chest 2 View Result Date: 01/24/2024 CLINICAL DATA:  Left-sided back pain and cough for 1 week. EXAM: CHEST - 2 VIEW COMPARISON:  06/26/2019. FINDINGS: The heart size and mediastinal contours are within normal limits. Both lungs are clear. There is partial visualization of lumbar spinal fusion hardware. Degenerative changes are noted in the thoracic spine. No acute osseous abnormality. IMPRESSION: No active cardiopulmonary disease. Electronically Signed   By: Leita Birmingham M.D.   On: 01/24/2024 14:25    Procedures Procedures (including critical care time)  Medications Ordered in UC Medications - No data to display  Initial Impression / Assessment and Plan / UC Course  I have reviewed the triage vital signs and the nursing notes.  Pertinent labs &  imaging results that were available during my care of the patient were reviewed by me and considered in my medical decision making (see chart for details).  On exam, the patient's lung sounds are clear throughout, room air sats at 94%.  He does have some left CVA tenderness.  The chest x-ray was negative for pneumonia, urinalysis was positive for blood, suggestive of a possible kidney stone.  Patient's symptoms have been present for the past several days, with an existing underlying fever.  Will treat patient empirically for acute sinusitis with Augmentin  875/125 mg, fluticasone 50 micro nasal spray for nasal congestion and  runny nose, and Promethazine  DM for the cough.  For the probable left kidney stone, we will treat with tamsulosin 0.4 mg.  Supportive care recommendations were provided discussed with the patient to include fluids, rest, over-the-counter analgesics, and use of a humidifier during sleep.  For the stone, patient was advised to increase his fluids, and take over-the-counter analgesics.  Patient was advised he may need to follow-up with his primary care physician to discuss whether or not referral to urology is necessary.  Discussed indications with patient regarding follow-up, patient was also given strict ER follow-up precautions.  Patient was in agreement with this plan of care and verbalizes understanding.  All questions were answered.  Patient stable for discharge.  Work note was provided.  Final Clinical Impressions(s) / UC Diagnoses   Final diagnoses:  Left flank pain  Cough, unspecified type  Acute sinusitis, recurrence not specified, unspecified location  History of kidney stones     Discharge Instructions      Take medication as prescribed. You may take over-the-counter Tylenol  as needed for pain, fever, or general discomfort. Increase your fluid intake while symptoms persist. Recommend normal saline nasal spray throughout the day for nasal congestion and runny nose. For your cough, recommend use of a humidifier in your bedroom at nighttime during sleep and sleeping elevated on pillows while cough symptoms persist.  For your flank pain: Take medication as prescribed. Continue to drink plenty of fluids. You may take over-the-counter Tylenol  as needed for pain or discomfort. Develop a toileting schedule that lit will allow you to urinate at least every 2 hours. Avoid caffeine such as tea, soda, or coffee while symptoms persist. Go to the emergency department if you experience worsening flank pain, develop new urinary symptoms, fever, or chills. Please follow-up with your primary care  physician to discuss possible referral to urology if symptoms continue to persist. Follow-up as needed.     ED Prescriptions     Medication Sig Dispense Auth. Provider   amoxicillin -clavulanate (AUGMENTIN ) 875-125 MG tablet Take 1 tablet by mouth every 12 (twelve) hours. 14 tablet Leath-Warren, Etta PARAS, NP   fluticasone (FLONASE) 50 MCG/ACT nasal spray Place 2 sprays into both nostrils daily. 16 g Leath-Warren, Etta PARAS, NP   tamsulosin (FLOMAX) 0.4 MG CAPS capsule Take 1 capsule (0.4 mg total) by mouth daily after supper. 30 capsule Leath-Warren, Etta PARAS, NP   promethazine -dextromethorphan (PROMETHAZINE -DM) 6.25-15 MG/5ML syrup Take 5 mLs by mouth 4 (four) times daily as needed. 118 mL Leath-Warren, Etta PARAS, NP      PDMP not reviewed this encounter.   Gilmer Etta PARAS, NP 01/24/24 1443

## 2024-01-29 DIAGNOSIS — E782 Mixed hyperlipidemia: Secondary | ICD-10-CM | POA: Diagnosis not present

## 2024-02-04 ENCOUNTER — Other Ambulatory Visit: Payer: Self-pay | Admitting: Cardiology

## 2024-02-04 DIAGNOSIS — R109 Unspecified abdominal pain: Secondary | ICD-10-CM | POA: Diagnosis not present

## 2024-02-04 DIAGNOSIS — I129 Hypertensive chronic kidney disease with stage 1 through stage 4 chronic kidney disease, or unspecified chronic kidney disease: Secondary | ICD-10-CM | POA: Diagnosis not present

## 2024-02-04 DIAGNOSIS — R7303 Prediabetes: Secondary | ICD-10-CM | POA: Diagnosis not present

## 2024-02-04 DIAGNOSIS — I480 Paroxysmal atrial fibrillation: Secondary | ICD-10-CM | POA: Diagnosis not present

## 2024-02-04 DIAGNOSIS — I10 Essential (primary) hypertension: Secondary | ICD-10-CM | POA: Diagnosis not present

## 2024-02-04 MED ORDER — APIXABAN 5 MG PO TABS: 5.0000 mg | ORAL_TABLET | Freq: Two times a day (BID) | ORAL | 1 refills | Status: AC

## 2024-02-04 NOTE — Telephone Encounter (Signed)
 Prescription refill request for Eliquis  received. Indication:afib Last office visit:9/25 Scr:1.64  2025 Age: 63 Weight:93.4  kg  Prescription refilled

## 2024-02-05 ENCOUNTER — Other Ambulatory Visit (HOSPITAL_COMMUNITY): Payer: Self-pay | Admitting: Internal Medicine

## 2024-02-05 ENCOUNTER — Encounter: Payer: Self-pay | Admitting: Family Medicine

## 2024-02-05 DIAGNOSIS — Z87442 Personal history of urinary calculi: Secondary | ICD-10-CM

## 2024-03-08 ENCOUNTER — Ambulatory Visit (HOSPITAL_COMMUNITY)
Admission: RE | Admit: 2024-03-08 | Discharge: 2024-03-08 | Disposition: A | Source: Ambulatory Visit | Attending: Internal Medicine | Admitting: Internal Medicine

## 2024-03-08 DIAGNOSIS — Z87442 Personal history of urinary calculi: Secondary | ICD-10-CM

## 2024-03-26 DIAGNOSIS — I77810 Thoracic aortic ectasia: Secondary | ICD-10-CM | POA: Diagnosis not present

## 2024-03-26 DIAGNOSIS — G4733 Obstructive sleep apnea (adult) (pediatric): Secondary | ICD-10-CM | POA: Diagnosis not present

## 2024-03-26 DIAGNOSIS — I351 Nonrheumatic aortic (valve) insufficiency: Secondary | ICD-10-CM | POA: Diagnosis not present

## 2024-03-26 DIAGNOSIS — N1832 Chronic kidney disease, stage 3b: Secondary | ICD-10-CM | POA: Diagnosis not present

## 2024-04-08 ENCOUNTER — Other Ambulatory Visit: Payer: Self-pay | Admitting: Cardiology

## 2024-04-19 ENCOUNTER — Other Ambulatory Visit: Payer: Self-pay | Admitting: Cardiology

## 2024-06-15 ENCOUNTER — Ambulatory Visit: Admitting: Cardiology
